# Patient Record
Sex: Male | Born: 1975 | Race: Black or African American | Hispanic: No | Marital: Married | Smoking: Current every day smoker
Health system: Southern US, Community
[De-identification: ages and names within clinical notes are randomized; demographics above are authoritative.]

## PROBLEM LIST (undated history)

## (undated) HISTORY — PX: KNEE SURGERY: SHX244

---

## 1999-06-28 ENCOUNTER — Emergency Department (HOSPITAL_COMMUNITY): Admission: EM | Admit: 1999-06-28 | Discharge: 1999-06-28 | Payer: Self-pay | Admitting: Emergency Medicine

## 1999-06-28 ENCOUNTER — Encounter: Payer: Self-pay | Admitting: Emergency Medicine

## 2004-10-13 ENCOUNTER — Emergency Department (HOSPITAL_COMMUNITY): Admission: EM | Admit: 2004-10-13 | Discharge: 2004-10-13 | Payer: Self-pay | Admitting: Emergency Medicine

## 2005-08-05 ENCOUNTER — Emergency Department (HOSPITAL_COMMUNITY): Admission: EM | Admit: 2005-08-05 | Discharge: 2005-08-05 | Payer: Self-pay | Admitting: Emergency Medicine

## 2009-02-10 ENCOUNTER — Emergency Department (HOSPITAL_COMMUNITY): Admission: EM | Admit: 2009-02-10 | Discharge: 2009-02-11 | Payer: Self-pay | Admitting: Emergency Medicine

## 2009-02-14 ENCOUNTER — Emergency Department (HOSPITAL_COMMUNITY): Admission: EM | Admit: 2009-02-14 | Discharge: 2009-02-14 | Payer: Self-pay | Admitting: Emergency Medicine

## 2009-02-17 ENCOUNTER — Encounter: Admission: RE | Admit: 2009-02-17 | Discharge: 2009-02-17 | Payer: Self-pay | Admitting: Orthopedic Surgery

## 2011-08-07 ENCOUNTER — Emergency Department (HOSPITAL_COMMUNITY)
Admission: EM | Admit: 2011-08-07 | Discharge: 2011-08-07 | Disposition: A | Discharge status: Home / Self Care | Payer: Self-pay | Attending: Emergency Medicine | Admitting: Emergency Medicine

## 2011-08-07 ENCOUNTER — Emergency Department (HOSPITAL_COMMUNITY): Payer: Self-pay

## 2011-08-07 DIAGNOSIS — S0280XA Fracture of other specified skull and facial bones, unspecified side, initial encounter for closed fracture: Secondary | ICD-10-CM | POA: Insufficient documentation

## 2011-08-07 DIAGNOSIS — F1010 Alcohol abuse, uncomplicated: Secondary | ICD-10-CM | POA: Insufficient documentation

## 2011-08-07 DIAGNOSIS — Y92410 Unspecified street and highway as the place of occurrence of the external cause: Secondary | ICD-10-CM | POA: Insufficient documentation

## 2011-08-07 DIAGNOSIS — W010XXA Fall on same level from slipping, tripping and stumbling without subsequent striking against object, initial encounter: Secondary | ICD-10-CM | POA: Insufficient documentation

## 2011-08-07 DIAGNOSIS — R51 Headache: Secondary | ICD-10-CM | POA: Insufficient documentation

## 2011-08-07 DIAGNOSIS — R220 Localized swelling, mass and lump, head: Secondary | ICD-10-CM | POA: Insufficient documentation

## 2011-08-07 DIAGNOSIS — M542 Cervicalgia: Secondary | ICD-10-CM | POA: Insufficient documentation

## 2011-08-07 DIAGNOSIS — T1490XA Injury, unspecified, initial encounter: Secondary | ICD-10-CM | POA: Insufficient documentation

## 2011-08-07 LAB — DIFFERENTIAL
Basophils Absolute: 0 10*3/uL (ref 0.0–0.1)
Lymphocytes Relative: 24 % (ref 12–46)
Lymphs Abs: 1.6 10*3/uL (ref 0.7–4.0)
Monocytes Absolute: 0.6 10*3/uL (ref 0.1–1.0)
Neutro Abs: 4.2 10*3/uL (ref 1.7–7.7)

## 2011-08-07 LAB — CBC
HCT: 38.9 % — ABNORMAL LOW (ref 39.0–52.0)
Hemoglobin: 13.7 g/dL (ref 13.0–17.0)
MCHC: 35.2 g/dL (ref 30.0–36.0)
WBC: 6.6 10*3/uL (ref 4.0–10.5)

## 2011-08-07 LAB — BASIC METABOLIC PANEL
Chloride: 99 mEq/L (ref 96–112)
GFR calc non Af Amer: 90 mL/min — ABNORMAL LOW (ref >90)
Glucose, Bld: 106 mg/dL — ABNORMAL HIGH (ref 70–99)
Potassium: 3.5 mEq/L (ref 3.5–5.1)
Sodium: 136 mEq/L (ref 135–145)

## 2011-08-08 ENCOUNTER — Emergency Department (HOSPITAL_COMMUNITY)
Admission: EM | Admit: 2011-08-08 | Discharge: 2011-08-08 | Disposition: A | Discharge status: Home / Self Care | Payer: Self-pay | Attending: Emergency Medicine | Admitting: Emergency Medicine

## 2011-08-08 DIAGNOSIS — X58XXXA Exposure to other specified factors, initial encounter: Secondary | ICD-10-CM | POA: Insufficient documentation

## 2011-08-08 DIAGNOSIS — S0280XA Fracture of other specified skull and facial bones, unspecified side, initial encounter for closed fracture: Secondary | ICD-10-CM | POA: Insufficient documentation

## 2011-08-08 DIAGNOSIS — S0990XA Unspecified injury of head, initial encounter: Secondary | ICD-10-CM | POA: Insufficient documentation

## 2011-09-13 ENCOUNTER — Encounter: Payer: Self-pay | Admitting: *Deleted

## 2011-09-13 ENCOUNTER — Emergency Department (HOSPITAL_COMMUNITY)
Admission: EM | Admit: 2011-09-13 | Discharge: 2011-09-13 | Disposition: A | Discharge status: Home / Self Care | Payer: Self-pay | Attending: Emergency Medicine | Admitting: Emergency Medicine

## 2011-09-13 DIAGNOSIS — K089 Disorder of teeth and supporting structures, unspecified: Secondary | ICD-10-CM | POA: Insufficient documentation

## 2011-09-13 DIAGNOSIS — X58XXXA Exposure to other specified factors, initial encounter: Secondary | ICD-10-CM | POA: Insufficient documentation

## 2011-09-13 DIAGNOSIS — S025XXA Fracture of tooth (traumatic), initial encounter for closed fracture: Secondary | ICD-10-CM | POA: Insufficient documentation

## 2011-09-13 DIAGNOSIS — K0889 Other specified disorders of teeth and supporting structures: Secondary | ICD-10-CM

## 2011-09-13 DIAGNOSIS — H9209 Otalgia, unspecified ear: Secondary | ICD-10-CM | POA: Insufficient documentation

## 2011-09-13 MED ORDER — HYDROCODONE-ACETAMINOPHEN 5-325 MG PO TABS: 2.0000 | ORAL_TABLET | ORAL | Status: AC | PRN

## 2011-09-13 NOTE — ED Notes (Signed)
Pt has lower left molar tooth pain

## 2011-09-13 NOTE — ED Provider Notes (Signed)
History     CSN: [id number] Arrival date & time: 09/13/2011  3:45 AM   First MD Initiated Contact with Patient 09/13/11 0406      Chief Complaint  Patient presents with  . Dental Pain     HPI  Hx provided by pt.  Pt reports having a piece of left 1st molar break off after getting food stuck between teeth last week.  Pt has had increasing pain described as nerve pain to tooth.  Pt has been busy with work and unable to get in to see his dentist.  Last night pain was at worst and he could not sleep. Pain is worse with cold and hot beverage and sometimes with pressure. Pain radiates to left ear. He denies fever, chills,swelling under tongue, nausea or vomiting.   History reviewed. No pertinent past medical history.  History reviewed. No pertinent past surgical history.  Family History  Problem Relation Age of Onset  . Diabetes Mother   . Coronary artery disease Father     History  Substance Use Topics  . Smoking status: Current Everyday Smoker -- 0.5 packs/day for 20 years    Types: Cigarettes  . Smokeless tobacco: Not on file  . Alcohol Use: Yes     socially      Review of Systems  Constitutional: Negative for fever and chills.  All other systems reviewed and are negative.    Allergies  Review of patient's allergies indicates no known allergies.  Home Medications   Current Outpatient Rx  Name Route Sig Dispense Refill  . ACETAMINOPHEN 500 MG PO TABS Oral Take 1,000 mg by mouth every 6 (six) hours as needed. Headache pain or fever     . IBUPROFEN 200 MG PO TABS Oral Take 200-800 mg by mouth every 6 (six) hours as needed. Headache or pain       BP 140/97  Pulse 81  Temp(Src) 97.1 F (36.2 C) (Oral)  Resp 18  Ht 6\' 2"  (1.88 m)  Wt 200 lb (90.719 kg)  BMI 25.68 kg/m2  SpO2 99%  Physical Exam  Nursing note and vitals reviewed. Constitutional: He is oriented to person, place, and time. He appears well-developed and well-nourished. No distress.  HENT:    Head: Normocephalic.  Mouth/Throat: Oropharynx is clear and moist.       Posterior aspect of left lower 1st molar missing with metal filling in place over top of tooth.  No swelling of gums or under tongue.  No pain with percussion over tooth.  Neck: Normal range of motion.  Cardiovascular: Normal rate.   No murmur heard. Pulmonary/Chest: Effort normal.  Lymphadenopathy:    He has no cervical adenopathy.  Neurological: He is alert and oriented to person, place, and time.  Skin: Skin is warm.  Psychiatric: He has a normal mood and affect.    ED Course  Procedures (including critical care time)    Dental block performed.  Verbal consent obtained. Time out taken. Procedure: Local dental block to left lower first molar  Anesthesia: 1.8 mg bupivacaine with epinephrine.  Patient tolerated procedure well pain improved  Labs Reviewed - No data to display No results found.   1. Pain, dental   2. Dental fracture       MDM          Angus Seller, PA 09/13/11 [phone] Medical screening examination/treatment/procedure(s) were performed by non-physician practitioner and as supervising physician I was immediately available for consultation/collaboration.  Cyndra Numbers, MD 09/13/11 [phone]

## 2011-11-07 ENCOUNTER — Encounter (HOSPITAL_COMMUNITY): Payer: Self-pay | Admitting: *Deleted

## 2011-11-07 ENCOUNTER — Emergency Department (HOSPITAL_COMMUNITY)
Admission: EM | Admit: 2011-11-07 | Discharge: 2011-11-07 | Disposition: A | Discharge status: Home / Self Care | Payer: Self-pay | Attending: Emergency Medicine | Admitting: Emergency Medicine

## 2011-11-07 DIAGNOSIS — K029 Dental caries, unspecified: Secondary | ICD-10-CM | POA: Insufficient documentation

## 2011-11-07 DIAGNOSIS — R220 Localized swelling, mass and lump, head: Secondary | ICD-10-CM | POA: Insufficient documentation

## 2011-11-07 DIAGNOSIS — X58XXXA Exposure to other specified factors, initial encounter: Secondary | ICD-10-CM | POA: Insufficient documentation

## 2011-11-07 DIAGNOSIS — S025XXA Fracture of tooth (traumatic), initial encounter for closed fracture: Secondary | ICD-10-CM | POA: Insufficient documentation

## 2011-11-07 DIAGNOSIS — K089 Disorder of teeth and supporting structures, unspecified: Secondary | ICD-10-CM | POA: Insufficient documentation

## 2011-11-07 MED ORDER — HYDROCODONE-ACETAMINOPHEN 5-325 MG PO TABS: 1.0000 | ORAL_TABLET | ORAL | Status: AC | PRN

## 2011-11-07 MED ORDER — AMOXICILLIN 500 MG PO CAPS: 500.0000 mg | ORAL_CAPSULE | Freq: Three times a day (TID) | ORAL | Status: AC

## 2011-11-07 NOTE — ED Provider Notes (Signed)
History     CSN: [id number]  Arrival date & time 11/07/11  1055   First MD Initiated Contact with Patient 11/07/11 1115      Chief Complaint  Patient presents with  . Facial Swelling  . Dental Pain    (Consider location/radiation/quality/duration/timing/severity/associated sxs/prior treatment) HPI... left lower tooth pain for several days. Patient claims a small piece of tooth broke off while flossing.  Palpation makes it worse. No stiff neck. Pain is moderate  History reviewed. No pertinent past medical history.  Past Surgical History  Procedure Date  . Knee surgery     Family History  Problem Relation Age of Onset  . Diabetes Mother   . Coronary artery disease Father     History  Substance Use Topics  . Smoking status: Current Everyday Smoker -- 0.5 packs/day for 20 years    Types: Cigarettes  . Smokeless tobacco: Not on file  . Alcohol Use: Yes     socially      Review of Systems  All other systems reviewed and are negative.    Allergies  Review of patient's allergies indicates no known allergies.  Home Medications   Current Outpatient Rx  Name Route Sig Dispense Refill  . IBUPROFEN 200 MG PO TABS Oral Take 400 mg by mouth every 6 (six) hours as needed. Headache or pain    . AMOXICILLIN 500 MG PO CAPS Oral Take 1 capsule (500 mg total) by mouth 3 (three) times daily. 30 capsule 0  . HYDROCODONE-ACETAMINOPHEN 5-325 MG PO TABS Oral Take 1-2 tablets by mouth every 4 (four) hours as needed for pain. 25 tablet 0    BP 131/81  Pulse 85  Temp(Src) 98 F (36.7 C) (Oral)  Ht 6\' 2"  (1.88 m)  Wt 200 lb (90.719 kg)  BMI 25.68 kg/m2  SpO2 98%  Physical Exam  Nursing note and vitals reviewed. Constitutional: He is oriented to person, place, and time. He appears well-developed and well-nourished.  HENT:       Obvious caries and left lower premolar.  Tenderness to palpation  Neck: Normal range of motion.  Neurological: He is alert and oriented to person,  place, and time.  Skin: Skin is warm and dry.  Psychiatric: He has a normal mood and affect.    ED Course  Procedures (including critical care time)  Labs Reviewed - No data to display No results found.   1. Tooth fracture       MDM  Rx antibiotics and pain medication        Donnetta Hutching, MD 11/07/11 1439

## 2011-11-07 NOTE — ED Notes (Signed)
Patient has a cracked molar.  He has swelling and pain.  He noted onset of pain 1 day ago.  He states the pain and swelling has increased.

## 2012-01-25 ENCOUNTER — Emergency Department (HOSPITAL_COMMUNITY)
Admission: EM | Admit: 2012-01-25 | Discharge: 2012-01-25 | Disposition: A | Discharge status: Home / Self Care | Payer: Self-pay | Attending: Emergency Medicine | Admitting: Emergency Medicine

## 2012-01-25 DIAGNOSIS — K0889 Other specified disorders of teeth and supporting structures: Secondary | ICD-10-CM

## 2012-01-25 DIAGNOSIS — F17200 Nicotine dependence, unspecified, uncomplicated: Secondary | ICD-10-CM | POA: Insufficient documentation

## 2012-01-25 DIAGNOSIS — K029 Dental caries, unspecified: Secondary | ICD-10-CM | POA: Insufficient documentation

## 2012-01-25 MED ORDER — AMOXICILLIN 500 MG PO CAPS: 500.0000 mg | ORAL_CAPSULE | Freq: Three times a day (TID) | ORAL | Status: AC

## 2012-01-25 MED ORDER — OXYCODONE-ACETAMINOPHEN 5-325 MG PO TABS: 1.0000 | ORAL_TABLET | Freq: Four times a day (QID) | ORAL | Status: AC | PRN

## 2012-01-25 NOTE — ED Provider Notes (Signed)
History     CSN: [id number]  Arrival date & time 01/25/12  1315   First MD Initiated Contact with Patient 01/25/12 1401      Chief Complaint  Patient presents with  . Dental Pain   patient states that he had a partial dental fracture in the right lower molar. A couple of weeks ago. He was using over-the-counter Anusol. He also had seen a dentist, and was waiting to get into a program to assist with pain and in April. He has had worsening pain, over the past couple of days. He denies any significant neck swelling, or induration. Denies any swelling of the tongue or the mouth. No fevers, nausea, vomiting. No other systemic symptoms.  (Consider location/radiation/quality/duration/timing/severity/associated sxs/prior treatment) HPI  No past medical history on file.  Past Surgical History  Procedure Date  . Knee surgery     Family History  Problem Relation Age of Onset  . Diabetes Mother   . Coronary artery disease Father     History  Substance Use Topics  . Smoking status: Current Everyday Smoker -- 0.5 packs/day for 20 years    Types: Cigarettes  . Smokeless tobacco: Not on file  . Alcohol Use: Yes     socially      Review of Systems  All other systems reviewed and are negative.    Allergies  Review of patient's allergies indicates no known allergies.  Home Medications   Current Outpatient Rx  Name Route Sig Dispense Refill  . IBUPROFEN 200 MG PO TABS Oral Take 400 mg by mouth every 6 (six) hours as needed. Headache or pain      BP 129/78  Pulse 83  Temp(Src) 98.1 F (36.7 C) (Oral)  Resp 20  SpO2 96%  Physical Exam  Nursing note and vitals reviewed. Constitutional: He is oriented to person, place, and time. He appears well-developed and well-nourished.  HENT:  Head: Normocephalic and atraumatic.       Right lower molar is discolored, and carious. No obvious surrounding abscess. No odor for the mouth is moist, and soft. Airways patent uvula is midline    Eyes: Conjunctivae and EOM are normal. Pupils are equal, round, and reactive to light.  Neck: Neck supple. No tracheal deviation present. No thyromegaly present.       Minimal 1 small anterior cervical lymph node. There is no induration. No redness no swelling. No significant tenderness to the anterior neck  Cardiovascular: Normal rate and regular rhythm.  Exam reveals no gallop and no friction rub.   No murmur heard. Pulmonary/Chest: Breath sounds normal. He has no wheezes. He has no rales. He exhibits no tenderness.  Abdominal: Soft. Bowel sounds are normal. He exhibits no distension. There is no tenderness. There is no rebound and no guarding.  Musculoskeletal: Normal range of motion.  Neurological: He is alert and oriented to person, place, and time. No cranial nerve deficit. Coordination normal.  Skin: Skin is warm and dry. No rash noted.  Psychiatric: He has a normal mood and affect.    ED Course  Procedures (including critical care time)  Labs Reviewed - No data to display No results found.   No diagnosis found.    MDM  Patient is seen and examined, initial history and physical is completed. Evaluation initiated     Patient seen for dental caries. Will place on a course of amoxicillin and a short course of Percocet for breakthrough pain. He is given multiple numbers for dental followup.  Nirvi Boehler A. Patrica Duel, MD 01/25/12 1610

## 2012-01-25 NOTE — Discharge Instructions (Signed)
Dental Caries  Tooth decay (dental caries, cavities) is the most common of all oral diseases. It occurs in all ages but is more common in children and young adults.  CAUSES  Bacteria in your mouth combine with foods (particularly sugars and starches) to produce plaque. Plaque is a substance that sticks to the hard surfaces of teeth. The bacteria in the plaque produce acids that attack the enamel of teeth. Repeated acid attacks dissolve the enamel and create holes in the teeth. Root surfaces of teeth may also get these holes.  Other contributing factors include:   Frequent snacking and drinking of cavity-producing foods and liquids.   Poor oral hygiene.   Dry mouth.   Substance abuse such as methamphetamine.   Broken or poor fitting dental restorations.   Eating disorders.   Gastroesophageal reflux disease (GERD).   Certain radiation treatments to the head and neck.  SYMPTOMS  At first, dental decay appears as white, chalky areas on the enamel. In this early stage, symptoms are seldom present. As the decay progresses, pits and holes may appear on the enamel surfaces. Progression of the decay will lead to softening of the hard layers of the tooth. At this point you may experience some pain or achy feeling after sweet, hot, or cold foods or drinks are consumed. If left untreated, the decay will reach the internal structures of the tooth and produce severe pain. Extensive dental treatment, such as root canal therapy, may be needed to save the tooth at this late stage of decay development.  DIAGNOSIS  Most cavities will be detected during regular check-ups. A thorough medical and dental history will be taken by the dentist. The dentist will use instruments to check the surfaces of your teeth for any breakdown or discoloration. Some dentists have special instruments, such as lasers, that detect tooth decay. Dental X-rays may also show some cavities that are not visible to the eye (such as between  the contact areas of the teeth). TREATMENT  Treatment involves removal of the tooth decay and replacement with a restorative material such as silver, gold, or composite (white) material. However, if the decay involves a large area of the tooth and there is little remaining healthy tooth structure, a cap (crown) will be fitted over the remaining structure. If the decay involves the center part of the tooth (pulp), root canal treatment will be needed before any type of dental restoration is placed. If the tooth is severely destroyed by the decay process, leaving the remaining tooth structures unrestorable, the tooth will need to be pulled (extracted). Some early tooth decay may be reversed by fluoride treatments and thorough brushing and flossing at home. PREVENTION   Eat healthy foods. Restrict the amount of sugary, starchy foods and liquids you consume. Avoid frequent snacking and drinking of unhealthy foods and liquids.   Sealants can help with prevention of cavities. Sealants are composite resins applied onto the biting surfaces of teeth at risk for decay. They smooth out the pits and grooves and prevent food from being trapped in them. This is done in early childhood before tooth decay has started.   Fluoride tablets may also be prescribed to children between 6 months and 19 years of age if your drinking water is not fluoridated. The fluoride absorbed by the tooth enamel makes teeth less susceptible to decay. Thorough daily cleaning with a toothbrush and dental floss is the best way to prevent cavities. Use of a fluoride toothpaste is highly recommended. Fluoride mouth rinses  may be used in specific cases.   Topical application of fluoride by your dentist is important in children.   Regular visits with a dentist for checkups and cleanings are also important.  SEEK IMMEDIATE DENTAL CARE IF:  You have a fever.   You develop redness and swelling of your face, jaw, or neck.   You develop swelling  around a tooth.   You are unable to open your mouth or cannot swallow.   You have severe pain uncontrolled by pain medicine.  Document Released: 07/14/2002 Document Revised: 10/11/2011 Document Reviewed: 03/29/2011 [facility] Patient Information 2012 [place], [place].    Dental Assistance If the dentist on-call cannot see you, please use the resources below:   Patients with Medicaid: [facility] [phone], [phone] [address] [phone]  If unable to pay, or uninsured, contact [facility] [phone]) or [facility] Department [phone] in [place], [phone] in [facility]) to become qualified for the adult dental clinic  Other Low-Cost Community Dental Services: [facility]- [address] Natasha Bence [place], [place], [address]    [phone], Ext. [phone]    2nd and 4th Thursday of the month at 6:30am    10 clients each day by appointment, can sometimes see walk-in     patients if someone does not show for an appointment [facility]- [address] Ether Griffins [place], [place], [address]    [phone] [facility] [address] [place], [place], [address]    [phone]  [facility] Health Department- [phone] [facility] Health Department- [phone] [facility] [phone]    If you have no primary doctor, here are some resources that may be helpful:  Medicaid-accepting [facility] Providers:   - [facility]- [address]      [phone]      Mon-Fri 9am-7pm, Sat 9am-1pm   - [facility]- [address] [place], [address] [place]      [phone]   - [facility]- [address] [place]      [phone]   [facility]- [address]      [phone]   - Renaye Rakers- [address] [place], [address]      [phone]      Only accepts [place] patients       after they have her name applied to  their card   Self Pay (no insurance) in [place]:   - Sickle Cell Patients: Dr Willey Blade, [facility]      [address] [place]      [phone]   - [facility]662 [phone]   - Physician Referral Service- [phone]   - [facility]- [address] [place]      [place]   [facility]- [address]   - [facility]- see information above      (Speak to [employer] if you do not have insurance)   - [facility]- [address] [place]      [phone]   - [facility] [place]- [address]      [phone]   - [facility]- [address]      [phone]   - Dr Julio Sicks-  [address], [place]      [phone]   -  [facility]- [address]      [phone]   - [facility]- [address]      [phone]      Also [address]      [phone]   - [facility]- [address]      [phone]      1st and 3rd Saturday every month, 10am-1pm Other agencies that provide inexpensive medical care:    [facility]  [phone]    [facility]  [phone]    [facility]  [phone]    [facility]  [phone]    [facility]  [phone]  General Information: Finding a doctor when you do not have health insurance can be tricky. Although you are not limited by an insurance plan, you are of course limited by her finances and how much but he can pay out of pocket.  What are your options if you don't have health insurance?   1) Find a Librarian, academic and Pay Out of Pocket Although you won't have to find out who is covered by your insurance plan, it is a good idea to ask around and get recommendations. You will then need to call the office and see if the doctor you have chosen will accept you as a new patient and what types of options they offer for patients who are self-pay. Some doctors offer discounts or will set  up payment plans for their patients who do not have insurance, but you will need to ask so you aren't surprised when you get to your appointment.  2) Contact Your Local Health Department Not all health departments have doctors that can see patients for sick visits, but many do, so it is worth a call to see if yours does. If you don't know where your local health department is, you can check in your phone book. The CDC also has a tool to help you locate your state's health department, and many state websites also have listings of all of their local health departments.  3) Find a Walk-in Clinic If your illness is not likely to be very severe or complicated, you may want to try a walk in clinic. These are popping up all over the country in pharmacies, drugstores, and shopping centers. They're usually staffed by nurse practitioners or physician assistants that have been trained to treat common illnesses and complaints. They're usually fairly quick and inexpensive. However, if you have serious medical issues or chronic medical problems, these are probably not your best option

## 2012-01-25 NOTE — ED Notes (Signed)
Dental pain onset 1-2 weeks ago bottom right jaw pain currently 10/10 throbbing. Seen Dentist waiting until getting into a program to assist with payment in April. Airway intact

## 2014-10-19 ENCOUNTER — Encounter (HOSPITAL_COMMUNITY): Payer: Self-pay

## 2014-10-19 ENCOUNTER — Emergency Department (HOSPITAL_COMMUNITY)
Admission: EM | Admit: 2014-10-19 | Discharge: 2014-10-19 | Disposition: A | Discharge status: Home / Self Care | Payer: Self-pay | Attending: Emergency Medicine | Admitting: Emergency Medicine

## 2014-10-19 DIAGNOSIS — Z720 Tobacco use: Secondary | ICD-10-CM | POA: Insufficient documentation

## 2014-10-19 DIAGNOSIS — K047 Periapical abscess without sinus: Secondary | ICD-10-CM | POA: Insufficient documentation

## 2014-10-19 MED ORDER — HYDROCODONE-ACETAMINOPHEN 5-325 MG PO TABS
2.0000 | ORAL_TABLET | Freq: Once | ORAL | Status: AC
  Administered 2014-10-19: 2 via ORAL
  Filled 2014-10-19: qty 2

## 2014-10-19 MED ORDER — HYDROCODONE-ACETAMINOPHEN 5-325 MG PO TABS: 2.0000 | ORAL_TABLET | ORAL | Status: DC | PRN

## 2014-10-19 MED ORDER — AMOXICILLIN 500 MG PO CAPS: 500.0000 mg | ORAL_CAPSULE | Freq: Three times a day (TID) | ORAL | Status: DC

## 2014-10-19 NOTE — ED Notes (Signed)
Pt had a tooth extracted and is now having right lower gum pain for the past 2 days.

## 2014-10-19 NOTE — ED Provider Notes (Signed)
CSN: [id number]     Arrival date & time 10/19/14  1603 History  This chart was scribed for non-physician practitioner, Langston MaskerKaren Yanis Juma, PA-C working with Samuel JesterKathleen McManus, DO by Luisa DagoPriscilla Tutu, ED scribe. This patient was seen in room TR08C/TR08C and the patient's care was started at 4:22 PM.    Chief Complaint  Patient presents with  . Dental Pain   Patient is a 38 y.o. male presenting with tooth pain. The history is provided by the patient. No language interpreter was used.  Dental Pain Location:  Lower Quality:  Shooting and throbbing Severity:  Mild Onset quality:  Gradual Duration:  2 days Timing:  Constant Progression:  Worsening Chronicity:  New Context: recent dental surgery (tooth extraction)   Relieved by:  Nothing Worsened by:  Pressure Ineffective treatments:  Acetaminophen Associated symptoms: gum swelling   Associated symptoms: no drooling, no facial pain, no facial swelling, no fever, no headaches, no neck pain, no oral bleeding and no oral lesions   Risk factors: no smoking    HPI Comments: Dwayne Small is a 38 y.o. male who presents to the Emergency Department complaining of lower dental pain which started after he got his lower molar pulled 2 days ago. Pt is now complaining of left sided jaw swelling and pain. He is also complaining of associated bad odor from the tooth. Pt does not have a regular dentist. He denies any fever, chills, nausea, emesis,  SOB, or sore throat.   No past medical history on file. Past Surgical History  Procedure Laterality Date  . Knee surgery     Family History  Problem Relation Age of Onset  . Diabetes Mother   . Coronary artery disease Father    History  Substance Use Topics  . Smoking status: Current Every Day Smoker -- 0.50 packs/day for 20 years    Types: Cigarettes  . Smokeless tobacco: Not on file  . Alcohol Use: Yes     Comment: socially    Review of Systems  Constitutional: Negative for fever.  HENT: Positive for  dental problem. Negative for drooling, facial swelling, mouth sores and sore throat.   Respiratory: Negative for shortness of breath.   Musculoskeletal: Negative for neck pain and neck stiffness.  Neurological: Negative for headaches.      Allergies  Review of patient's allergies indicates no known allergies.  Home Medications   Prior to Admission medications   Medication Sig Start Date End Date Taking? Authorizing Provider  ibuprofen (ADVIL,MOTRIN) 200 MG tablet Take 400 mg by mouth every 6 (six) hours as needed. Headache or pain    Historical Provider, MD   Triage Vitals:BP 127/68 mmHg  Pulse 93  Temp(Src) 98.9 F (37.2 C) (Oral)  Resp 18  Ht 6\' 2"  (1.88 m)  Wt 210 lb (95.255 kg)  BMI 26.95 kg/m2  SpO2 98%    Physical Exam  Constitutional: He is oriented to person, place, and time. He appears well-developed and well-nourished. No distress.  HENT:  Head: Normocephalic and atraumatic.  Eyes: Conjunctivae and EOM are normal.  Neck: Neck supple. No tracheal deviation present.  Cardiovascular: Normal rate.   Pulmonary/Chest: Effort normal. No respiratory distress.  Musculoskeletal: Normal range of motion.  Neurological: He is alert and oriented to person, place, and time.  Skin: Skin is warm and dry.  Psychiatric: He has a normal mood and affect. His behavior is normal.  Nursing note and vitals reviewed.   ED Course  Procedures (including critical care time)  DIAGNOSTIC  STUDIES: Oxygen Saturation is 98% on RA, normal by my interpretation.    COORDINATION OF CARE: 4:24 PM- Will give pt a referral to on-call dentist. Pt advised of plan for treatment and pt agrees.  Labs Review Labs Reviewed - No data to display  Imaging Review No results found.   EKG Interpretation None      MDM   Final diagnoses:  Dental infection    amoxicillian Hydrocodone Referral to dr. Lawrence Marseillescivils  No dentist on call I personally performed the services described in this  documentation, which was scribed in my presence. The recorded information has been reviewed and is accurate.    Elson AreasLeslie K Mintie Witherington, PA-C 10/19/14 1636  Samuel JesterKathleen McManus, DO 10/20/14 [phone]

## 2014-10-19 NOTE — Discharge Instructions (Signed)

## 2014-11-18 ENCOUNTER — Encounter (HOSPITAL_COMMUNITY): Payer: Self-pay | Admitting: Emergency Medicine

## 2014-11-18 ENCOUNTER — Emergency Department (HOSPITAL_COMMUNITY)
Admission: EM | Admit: 2014-11-18 | Discharge: 2014-11-18 | Disposition: A | Discharge status: Home / Self Care | Payer: Self-pay | Attending: Emergency Medicine | Admitting: Emergency Medicine

## 2014-11-18 DIAGNOSIS — F10920 Alcohol use, unspecified with intoxication, uncomplicated: Secondary | ICD-10-CM

## 2014-11-18 DIAGNOSIS — F10129 Alcohol abuse with intoxication, unspecified: Secondary | ICD-10-CM | POA: Insufficient documentation

## 2014-11-18 DIAGNOSIS — Z720 Tobacco use: Secondary | ICD-10-CM | POA: Insufficient documentation

## 2014-11-18 NOTE — ED Notes (Signed)
Per EMS: Pt found dressed in a suit and tie laying in the grass outside of [employer].  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  Pt has had a lot of alcohol today.  Pt nodding off in triage.
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
## 2014-11-21 ENCOUNTER — Emergency Department (HOSPITAL_COMMUNITY)
Admission: EM | Admit: 2014-11-21 | Discharge: 2014-11-22 | Disposition: A | Discharge status: Home / Self Care | Payer: Self-pay | Attending: Emergency Medicine | Admitting: Emergency Medicine

## 2014-11-21 ENCOUNTER — Encounter (HOSPITAL_COMMUNITY): Payer: Self-pay | Admitting: Emergency Medicine

## 2014-11-21 DIAGNOSIS — F1014 Alcohol abuse with alcohol-induced mood disorder: Secondary | ICD-10-CM | POA: Insufficient documentation

## 2014-11-21 DIAGNOSIS — Z720 Tobacco use: Secondary | ICD-10-CM | POA: Insufficient documentation

## 2014-11-21 DIAGNOSIS — F10120 Alcohol abuse with intoxication, uncomplicated: Secondary | ICD-10-CM | POA: Insufficient documentation

## 2014-11-21 DIAGNOSIS — F10920 Alcohol use, unspecified with intoxication, uncomplicated: Secondary | ICD-10-CM

## 2014-11-21 DIAGNOSIS — F1010 Alcohol abuse, uncomplicated: Secondary | ICD-10-CM | POA: Diagnosis present

## 2014-11-21 DIAGNOSIS — R45851 Suicidal ideations: Secondary | ICD-10-CM

## 2014-11-21 DIAGNOSIS — F10929 Alcohol use, unspecified with intoxication, unspecified: Secondary | ICD-10-CM | POA: Diagnosis present

## 2014-11-21 DIAGNOSIS — F1994 Other psychoactive substance use, unspecified with psychoactive substance-induced mood disorder: Secondary | ICD-10-CM | POA: Diagnosis present

## 2014-11-21 LAB — COMPREHENSIVE METABOLIC PANEL
ALK PHOS: 51 U/L (ref 39–117)
ALT: 18 U/L (ref 0–53)
AST: 30 U/L (ref 0–37)
Albumin: 4.7 g/dL (ref 3.5–5.2)
Anion gap: 9 (ref 5–15)
BILIRUBIN TOTAL: 0.5 mg/dL (ref 0.3–1.2)
BUN: 16 mg/dL (ref 6–23)
CALCIUM: 8.9 mg/dL (ref 8.4–10.5)
CO2: 29 mmol/L (ref 19–32)
CREATININE: 1.05 mg/dL (ref 0.50–1.35)
Chloride: 104 mEq/L (ref 96–112)
GFR, EST NON AFRICAN AMERICAN: 88 mL/min — AB (ref >90)
Glucose, Bld: 96 mg/dL (ref 70–99)
Potassium: 4.2 mmol/L (ref 3.5–5.1)
Sodium: 142 mmol/L (ref 135–145)
Total Protein: 7.7 g/dL (ref 6.0–8.3)

## 2014-11-21 LAB — CBC
HCT: 40.6 % (ref 39.0–52.0)
HEMOGLOBIN: 14.1 g/dL (ref 13.0–17.0)
MCH: 32.3 pg (ref 26.0–34.0)
MCHC: 34.7 g/dL (ref 30.0–36.0)
MCV: 92.9 fL (ref 78.0–100.0)
PLATELETS: 227 10*3/uL (ref 150–400)
RBC: 4.37 MIL/uL (ref 4.22–5.81)
RDW: 12.6 % (ref 11.5–15.5)
WBC: 7.1 10*3/uL (ref 4.0–10.5)

## 2014-11-21 LAB — SALICYLATE LEVEL

## 2014-11-21 LAB — ACETAMINOPHEN LEVEL

## 2014-11-21 LAB — ETHANOL: ALCOHOL ETHYL (B): 279 mg/dL — AB (ref 0–9)

## 2014-11-21 NOTE — ED Notes (Signed)
Pt is refusing to get vitals done

## 2014-11-21 NOTE — ED Notes (Signed)
Pt arrived to the ED under police escort under IVC paperwork.  Pt's paperwork states that he has made suicidal ideation by jumping in front of a train.  Pt has etoh on board.  Pt is upset about situation.

## 2014-11-21 NOTE — BH Assessment (Addendum)
Per Dr. Gwendolyn GrantWalden he is working on first opinion. Pt was brought in under IVC due to telling family he was going to jump off a bridge. Pt denies this now, and has etoh on board. Assessment to be initiated once order has been placed. Labs are pending. Pt was in ED on 11/18/14 for etoh intoxication after being found passed out drunk at [employer].   Clista BernhardtNancy Takota Cahalan, [facility] Triage Specialist 11/21/2014 10:14 PM

## 2014-11-21 NOTE — ED Provider Notes (Signed)
CSN: [id number]     Arrival date & time 11/21/14  2020 History   First MD Initiated Contact with Patient 11/21/14 2120     Chief Complaint  Patient presents with  . IVC    IVC     (Consider location/radiation/quality/duration/timing/severity/associated sxs/prior Treatment) Patient is a 39 y.o. male presenting with mental health disorder. The history is provided by the patient.  Mental Health Problem Presenting symptoms: suicidal thoughts and suicidal threats   Patient accompanied by:  Law enforcement Degree of incapacity (severity):  Moderate Onset quality:  Gradual Timing:  Constant Progression:  Unchanged Chronicity:  New Context: alcohol use   Associated symptoms: no abdominal pain     History reviewed. No pertinent past medical history. Past Surgical History  Procedure Laterality Date  . Knee surgery     Family History  Problem Relation Age of Onset  . Diabetes Mother   . Coronary artery disease Father    History  Substance Use Topics  . Smoking status: Current Every Day Smoker -- 0.50 packs/day for 20 years    Types: Cigarettes  . Smokeless tobacco: Not on file  . Alcohol Use: Yes     Comment: socially    Review of Systems  Constitutional: Negative for fever.  Respiratory: Negative for cough and shortness of breath.   Gastrointestinal: Negative for vomiting and abdominal pain.  Psychiatric/Behavioral: Positive for suicidal ideas.  All other systems reviewed and are negative.     Allergies  Review of patient's allergies indicates no known allergies.  Home Medications   Prior to Admission medications   Medication Sig Start Date End Date Taking? Authorizing Provider  ibuprofen (ADVIL,MOTRIN) 200 MG tablet Take 400 mg by mouth every 6 (six) hours as needed. Headache or pain   Yes Historical Provider, MD   BP 120/72 mmHg  Pulse 104  Temp(Src) 98.1 F (36.7 C) (Oral)  Resp 18  SpO2 98% Physical Exam  Constitutional: He is oriented to person, place,  and time. He appears well-developed and well-nourished. No distress.  HENT:  Head: Normocephalic and atraumatic.  Mouth/Throat: No oropharyngeal exudate.  Eyes: EOM are normal. Pupils are equal, round, and reactive to light.  Neck: Normal range of motion. Neck supple.  Cardiovascular: Normal rate and regular rhythm.  Exam reveals no friction rub.   No murmur heard. Pulmonary/Chest: Effort normal and breath sounds normal. No respiratory distress. He has no wheezes. He has no rales.  Abdominal: He exhibits no distension. There is no tenderness. There is no rebound.  Musculoskeletal: Normal range of motion. He exhibits no edema.  Neurological: He is alert and oriented to person, place, and time.  Skin: He is not diaphoretic.  Nursing note and vitals reviewed.   ED Course  Procedures (including critical care time) Labs Review Labs Reviewed  CBC  ACETAMINOPHEN LEVEL  COMPREHENSIVE METABOLIC PANEL  ETHANOL  SALICYLATE LEVEL  URINE RAPID DRUG SCREEN (HOSP PERFORMED)    Imaging Review No results found.   EKG Interpretation None      MDM   Final diagnoses:  Alcohol intoxication, uncomplicated  Suicidal ideation    2M presents under IVC. He was intoxicated and told his family he was suicidal and wanting to jump in front of a train. Here denies suicidality. He is intoxicated, but communicative and cooperative. Will clear medically and consult TTS.    Elwin MochaBlair Terese Heier, MD 11/21/14 [phone]

## 2014-11-22 DIAGNOSIS — R45851 Suicidal ideations: Secondary | ICD-10-CM

## 2014-11-22 DIAGNOSIS — F10120 Alcohol abuse with intoxication, uncomplicated: Secondary | ICD-10-CM

## 2014-11-22 DIAGNOSIS — F1010 Alcohol abuse, uncomplicated: Secondary | ICD-10-CM | POA: Diagnosis present

## 2014-11-22 DIAGNOSIS — F10929 Alcohol use, unspecified with intoxication, unspecified: Secondary | ICD-10-CM | POA: Diagnosis present

## 2014-11-22 DIAGNOSIS — F1994 Other psychoactive substance use, unspecified with psychoactive substance-induced mood disorder: Secondary | ICD-10-CM

## 2014-11-22 LAB — RAPID URINE DRUG SCREEN, HOSP PERFORMED
Amphetamines: NOT DETECTED
BENZODIAZEPINES: NOT DETECTED
Barbiturates: NOT DETECTED
Cocaine: NOT DETECTED
Opiates: NOT DETECTED
Tetrahydrocannabinol: NOT DETECTED

## 2014-11-22 MED ORDER — LORAZEPAM 2 MG/ML IJ SOLN: 0.0000 mg | Freq: Four times a day (QID) | INTRAMUSCULAR | Status: DC

## 2014-11-22 MED ORDER — NICOTINE 21 MG/24HR TD PT24
21.0000 mg | MEDICATED_PATCH | Freq: Every day | TRANSDERMAL | Status: DC
  Administered 2014-11-22: 21 mg via TRANSDERMAL
  Filled 2014-11-22: qty 1

## 2014-11-22 MED ORDER — LORAZEPAM 1 MG PO TABS: 1.0000 mg | ORAL_TABLET | Freq: Three times a day (TID) | ORAL | Status: DC | PRN

## 2014-11-22 MED ORDER — ACETAMINOPHEN 325 MG PO TABS: 650.0000 mg | ORAL_TABLET | ORAL | Status: DC | PRN

## 2014-11-22 MED ORDER — IBUPROFEN 200 MG PO TABS: 600.0000 mg | ORAL_TABLET | Freq: Three times a day (TID) | ORAL | Status: DC | PRN

## 2014-11-22 MED ORDER — LORAZEPAM 1 MG PO TABS: 0.0000 mg | ORAL_TABLET | Freq: Four times a day (QID) | ORAL | Status: DC

## 2014-11-22 MED ORDER — ALUM & MAG HYDROXIDE-SIMETH 200-200-20 MG/5ML PO SUSP: 30.0000 mL | ORAL | Status: DC | PRN

## 2014-11-22 MED ORDER — ZOLPIDEM TARTRATE 5 MG PO TABS: 5.0000 mg | ORAL_TABLET | Freq: Every evening | ORAL | Status: DC | PRN

## 2014-11-22 MED ORDER — LORAZEPAM 1 MG PO TABS: 0.0000 mg | ORAL_TABLET | Freq: Two times a day (BID) | ORAL | Status: DC

## 2014-11-22 MED ORDER — ONDANSETRON HCL 4 MG PO TABS: 4.0000 mg | ORAL_TABLET | Freq: Three times a day (TID) | ORAL | Status: DC | PRN

## 2014-11-22 MED ORDER — LORAZEPAM 2 MG/ML IJ SOLN: 0.0000 mg | Freq: Two times a day (BID) | INTRAMUSCULAR | Status: DC

## 2014-11-22 NOTE — ED Notes (Signed)
Pt has been asked multiple times to give a urine sample but each time pt says he is unable to give a sample and refuses to try.

## 2014-11-22 NOTE — ED Notes (Signed)
Bed: [id number] Expected date:  Expected time:  Means of arrival:  Comments: Room [address] [place]

## 2014-11-22 NOTE — [facility] Suicide Risk Assessment (Signed)
Suicide Risk Assessment  Discharge Assessment     Demographic Factors:  Male  Total Time spent with patient: 45 minutes  Psychiatric Specialty Exam:     Blood pressure 128/70, pulse 80, temperature 98.8 F (37.1 C), temperature source Oral, resp. rate 20, SpO2 96 %.There is no weight on file to calculate BMI.  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 Housing Leisure Time Physical Health Resilience Social Support Vocational/Educational  Sleep:      Musculoskeletal: Strength & Muscle Tone: within normal limits Gait & Station: normal Patient leans: N/A  Mental Status Per Nursing Assessment::   On Admission:   Alcohol intoxication  Current Mental Status by Physician: NA  Loss Factors: NA  Historical Factors: NA  Risk Reduction Factors:   Sense of responsibility to family, Employed, Living with another person, especially a relative, Positive social support and Positive coping skills or problem solving skills  Continued Clinical Symptoms:  Anxious to leave to return to work  Cognitive Features That Contribute To Risk:  None   Suicide Risk:  Minimal: No identifiable suicidal ideation.  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 No pertinent past medical history. AXIS IV:  other psychosocial or environmental problems and problems related to social environment AXIS V:  61-70 mild symptoms  Plan Of Care/Follow-up recommendations:  Activity:  as tolerated Diet:  heart healthy diet  Is patient on multiple antipsychotic therapies at discharge:  No   Has Patient had three or more failed trials of antipsychotic monotherapy by history:  No  Recommended Plan for Multiple Antipsychotic Therapies: NA    Dwayne Small, PMH-NP 11/22/2014, 4:37 PM

## 2014-11-22 NOTE — BH Assessment (Signed)
[facility] Assessment Progress Note  Pt's IVC has been rescinded by Thedore MinsMojeed Akintayo, MD.  Doylene Canninghomas Koltan Portocarrero, MA Triage Specialist 11/22/2014 @ 14:37

## 2014-11-22 NOTE — Progress Notes (Signed)
   CARE MANAGEMENT ED NOTE 11/22/2014  Patient:  Dwayne Small,Dwayne Small   Account Number:  [id number]  Date Initiated:  11/22/2014  Documentation initiated by:  Edd ArbourGIBBS,Alberta Cairns  Subjective/Objective Assessment:   [age] yr old self pay [place] pt  Pt IVC intoxicated told family he was SI wanting to jump in front of a train dx etoh intoxication , etoh use diosrder severe, depression disorder positive for benzo & marijuana 1st ED visit in last 6 m     Subjective/Objective Assessment Detail:   no pcp no coverage per pt  Updated his telephone number in EPIC [phone]  Deleted old number [phone]  Pt agreed to P4 CC referral     Action/Plan:   noted no pcp no coverage spoke with [facility] referral completed   Action/Plan Detail:   Anticipated DC Date:       Status Recommendation to Physician:   Result of Recommendation:    Other ED Services  Consult Working Psychologist, educationallan    DC Planning Services  Other  Outpatient Services - Pt will follow up  PCP issues  [facility] / P4[facility] (established/new)    Choice offered to / List presented to:            Status of service:  Completed, signed off  ED Comments:   ED Comments Detail:  CM spoke with pt who [place]. CM discussed and provided written information for self pay pcps, importance of pcp for f/u care, www.needymeds.org, www.goodrx.com, discounted pharmacies and other [place] resources such as [facility], [facility], affordable care act,  financial assistance, DSS and  health department  Reviewed resources for [place] self pay pcps like [facility], [facility] medicine at [address], [facility], general medical clinics, [facility] plus others, medication resources, [facility] out patient pharmacies and housing Pt voiced understanding and appreciation of resources provided  Provided [facility] contact information

## 2014-11-22 NOTE — Discharge Instructions (Signed)
To help you maintain a sober lifestyle a substance abuse treatment program may be beneficial to you.  Contact the following facilities at your earliest opportunity to see about enrolling:  RESIDENTIAL PROGRAMS:       [facility]      [address]      [place], [place] [address]      [phone]       [facility]      [address]      [place], [place] [address]      [phone]       [facility]      [address]      [place], [place] [address]      [phone]) [phone]  OUTPATIENT PROGRAMS:       [facility]      [address]      [place], [place] [address]      [phone]       [facility]      [address]      [place], [place] [address]      [phone]
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
## 2018-03-26 ENCOUNTER — Emergency Department (HOSPITAL_COMMUNITY): Payer: Self-pay

## 2018-03-26 ENCOUNTER — Encounter (HOSPITAL_COMMUNITY): Payer: Self-pay | Admitting: Emergency Medicine

## 2018-03-26 ENCOUNTER — Emergency Department (HOSPITAL_COMMUNITY)
Admission: EM | Admit: 2018-03-26 | Discharge: 2018-03-26 | Disposition: A | Discharge status: Home / Self Care | Payer: Self-pay | Attending: Physician Assistant | Admitting: Physician Assistant

## 2018-03-26 ENCOUNTER — Other Ambulatory Visit: Payer: Self-pay

## 2018-03-26 DIAGNOSIS — F17210 Nicotine dependence, cigarettes, uncomplicated: Secondary | ICD-10-CM | POA: Insufficient documentation

## 2018-03-26 DIAGNOSIS — G44201 Tension-type headache, unspecified, intractable: Secondary | ICD-10-CM | POA: Insufficient documentation

## 2018-03-26 MED ORDER — KETOROLAC TROMETHAMINE 30 MG/ML IJ SOLN
30.0000 mg | Freq: Once | INTRAMUSCULAR | Status: AC
  Administered 2018-03-26: 30 mg via INTRAMUSCULAR
  Filled 2018-03-26: qty 1

## 2018-03-26 NOTE — ED Provider Notes (Signed)
[facility]-EMERGENCY DEPT Provider Note   CSN: [id number] Arrival date & time: 03/26/18  1018     History   Chief Complaint Chief Complaint  Patient presents with  . Headache    HPI Dwayne Small is a 42 y.o. male with a past medical history of alcohol abuse, who presents to ED for evaluation of headache.  He states that he has had headache "on the top of my head" for the past several years but has worsened in the past 2 weeks.  He states that he usually "drinks to numb away the pain" but he has not had an alcoholic drink in about 2 to 2-1/2 weeks.  He is in an AA program. He is not taking any other medications to help with his symptoms.  He reports intermittent blurry vision for the past 2 weeks as well.  Denies any head injury, loss of consciousness, neck pain, fevers, vomiting, numbness in arms or legs, history of aneurysms.  HPI  History reviewed. No pertinent past medical history.  Patient Active Problem List   Diagnosis Date Noted  . Alcohol abuse 11/22/2014  . Alcohol intoxication (HCC) 11/22/2014  . Substance induced mood disorder (HCC) 11/22/2014  . Suicidal ideations 11/22/2014    Past Surgical History:  Procedure Laterality Date  . KNEE SURGERY          Home Medications    Prior to Admission medications   Not on File    Family History Family History  Problem Relation Age of Onset  . Diabetes Mother   . Coronary artery disease Father     Social History Social History   Tobacco Use  . Smoking status: Current Every Day Smoker    Packs/day: 0.50    Years: 20.00    Pack years: 10.00    Types: Cigarettes  Substance Use Topics  . Alcohol use: Yes    Comment: socially  . Drug use: No     Allergies   Patient has no known allergies.   Review of Systems Review of Systems  Constitutional: Negative for appetite change, chills and fever.  HENT: Negative for ear pain, rhinorrhea, sneezing and sore throat.   Eyes: Positive  for visual disturbance. Negative for photophobia.  Respiratory: Negative for cough, chest tightness, shortness of breath and wheezing.   Cardiovascular: Negative for chest pain and palpitations.  Gastrointestinal: Negative for abdominal pain, blood in stool, constipation, diarrhea, nausea and vomiting.  Genitourinary: Negative for dysuria, hematuria and urgency.  Musculoskeletal: Negative for myalgias.  Skin: Negative for rash.  Neurological: Positive for headaches. Negative for dizziness, weakness and light-headedness.     Physical Exam Updated Vital Signs BP 131/82 (BP Location: Left Arm)   Pulse 79   Temp 98.1 F (36.7 C) (Oral)   Resp 18   Ht  (1.905 m)   Wt 90.7 kg (200 lb)   SpO2 100%   BMI 25.00 kg/m   Physical Exam  Constitutional: He is oriented to person, place, and time. He appears well-developed and well-nourished. No distress.  HENT:  Head: Normocephalic and atraumatic.  Nose: Nose normal.  Eyes: Conjunctivae and EOM are normal. Left eye exhibits no discharge. No scleral icterus.  Neck: Normal range of motion. Neck supple.  No meningismus.  Cardiovascular: Normal rate, regular rhythm, normal heart sounds and intact distal pulses. Exam reveals no gallop and no friction rub.  No murmur heard. Pulmonary/Chest: Effort normal and breath sounds normal. No respiratory distress.  Abdominal: Soft. Bowel sounds  are normal. He exhibits no distension. There is no tenderness. There is no guarding.  Musculoskeletal: Normal range of motion. He exhibits no edema.  Neurological: He is alert and oriented to person, place, and time. He exhibits normal muscle tone. Coordination and gait normal.  Pupils reactive. No facial asymmetry noted. Cranial nerves appear grossly intact. Sensation intact to light touch on face, BUE and BLE. Strength 5/5 in BUE and BLE. Normal finger to nose coordination bilaterally.  Skin: Skin is warm and dry. No rash noted.  Psychiatric: He has a normal  mood and affect.  Nursing note and vitals reviewed.    ED Treatments / Results  Labs (all labs ordered are listed, but only abnormal results are displayed) Labs Reviewed - No data to display  EKG None  Radiology Ct Head Wo Contrast  Result Date: 03/26/2018 CLINICAL DATA:  Headaches for months worse in past few weeks, worsening pressure EXAM: CT HEAD WITHOUT CONTRAST TECHNIQUE: Contiguous axial images were obtained from the base of the skull through the vertex without intravenous contrast. Sagittal and coronal MPR images reconstructed from axial data set. COMPARISON:  11/27/2016 FINDINGS: Brain: Normal ventricular morphology. No midline shift or mass effect. Normal appearance of brain parenchyma. No intracranial hemorrhage, mass lesion, evidence of acute infarction, or extra-axial fluid collection. Vascular: Normal appearance Skull: Intact Sinuses/Orbits: Clear Other: N/A IMPRESSION: Normal exam. Electronically Signed   By: Ulyses Southward M.D.   On: 03/26/2018 12:44    Procedures Procedures (including critical care time)  Medications Ordered in ED Medications  ketorolac (TORADOL) 30 MG/ML injection 30 mg (30 mg Intramuscular Given 03/26/18 1324)     Initial Impression / Assessment and Plan / ED Course  I have reviewed the triage vital signs and the nursing notes.  Pertinent labs & imaging results that were available during my care of the patient were reviewed by me and considered in my medical decision making (see chart for details).     Patient, with a past medical history of alcohol abuse, presents to ED for evaluation of headache.  Headache has been going on for the past several years but worsened in the past 2 weeks.  Located on the top of his head.  He is currently in Alcoholics Anonymous program and has not had an alcoholic beverage in 2 to 2-1/2 weeks.  States that he usually uses this to help with his headaches.  Denies any head injuries or falls, neck pain, fevers, vomiting,  numbness in arms or legs.  On physical exam patient is overall well-appearing.  He does not appear dehydrated.  No deficits on neurological exam noted.  No meningismus.  He is afebrile.  CT of the head was done due to worsening headache and intermittent blurry vision which returned as negative.  Suspect symptoms are due to a tension type headache.  He does not appear to be withdrawing from alcohol. There are no headache characteristics that are lateralizing or concerning for increased ICP, infectious or vascular cause of his symptoms.    Reports improvement in his symptoms with Toradol given here.  Advised to return to ED for any severe worsening symptoms.  Portions of this note were generated with Scientist, clinical (histocompatibility and immunogenetics). Dictation errors may occur despite best attempts at proofreading.  Final Clinical Impressions(s) / ED Diagnoses   Final diagnoses:  Acute intractable tension-type headache    ED Discharge Orders    None       Dietrich Pates, PA-C 03/26/18 1332    Mackuen, Courteney  Lyn, MD 03/26/18 1545

## 2018-03-26 NOTE — ED Triage Notes (Signed)
Worsening pressure in head for a few weeks; "I might as well get checked out since I brought my son here."

## 2018-03-26 NOTE — Discharge Instructions (Signed)
Continue over-the-counter medications such as ibuprofen, Aleve or Tylenol to help with her headache. The CT of your head today was normal.

## 2018-07-15 ENCOUNTER — Emergency Department (HOSPITAL_COMMUNITY)
Admission: EM | Admit: 2018-07-15 | Discharge: 2018-07-15 | Disposition: A | Discharge status: Home / Self Care | Payer: Self-pay | Attending: Emergency Medicine | Admitting: Emergency Medicine

## 2018-07-15 ENCOUNTER — Emergency Department (HOSPITAL_COMMUNITY)
Admission: EM | Admit: 2018-07-15 | Discharge: 2018-07-16 | Disposition: A | Discharge status: Home / Self Care | Payer: Self-pay | Attending: Emergency Medicine | Admitting: Emergency Medicine

## 2018-07-15 ENCOUNTER — Encounter (HOSPITAL_COMMUNITY): Payer: Self-pay | Admitting: Family Medicine

## 2018-07-15 ENCOUNTER — Other Ambulatory Visit: Payer: Self-pay

## 2018-07-15 DIAGNOSIS — F17210 Nicotine dependence, cigarettes, uncomplicated: Secondary | ICD-10-CM | POA: Insufficient documentation

## 2018-07-15 DIAGNOSIS — J029 Acute pharyngitis, unspecified: Secondary | ICD-10-CM | POA: Insufficient documentation

## 2018-07-15 DIAGNOSIS — F1010 Alcohol abuse, uncomplicated: Secondary | ICD-10-CM | POA: Insufficient documentation

## 2018-07-15 DIAGNOSIS — J028 Acute pharyngitis due to other specified organisms: Secondary | ICD-10-CM

## 2018-07-15 LAB — GROUP A STREP BY PCR: GROUP A STREP BY PCR: NOT DETECTED

## 2018-07-15 MED ORDER — IBUPROFEN 800 MG PO TABS: 800.0000 mg | ORAL_TABLET | Freq: Three times a day (TID) | ORAL | 0 refills | Status: DC

## 2018-07-15 MED ORDER — IBUPROFEN 800 MG PO TABS
800.0000 mg | ORAL_TABLET | Freq: Once | ORAL | Status: AC
  Administered 2018-07-15: 800 mg via ORAL
  Filled 2018-07-15: qty 1

## 2018-07-15 NOTE — Discharge Instructions (Signed)
Continue to take ibuprofen. This can last up to one week

## 2018-07-15 NOTE — ED Notes (Signed)
Strep swab collected by PA. 

## 2018-07-15 NOTE — ED Provider Notes (Signed)
[facility]-EMERGENCY DEPT Provider Note   CSN: [id number] Arrival date & time: 07/15/18  1205     History   Chief Complaint Chief Complaint  Patient presents with  . Sore Throat  . Otalgia    HPI Dwayne Small is a 42 y.o. male.  Pt comes in with c/o sore throat that radiates to the ear that started today. Denies fever. Hasn't taken anything the symptoms. No abdominal pain or fever. Denies congestion     No past medical history on file.  Patient Active Problem List   Diagnosis Date Noted  . Alcohol abuse 11/22/2014  . Alcohol intoxication (HCC) 11/22/2014  . Substance induced mood disorder (HCC) 11/22/2014  . Suicidal ideations 11/22/2014    Past Surgical History:  Procedure Laterality Date  . KNEE SURGERY          Home Medications    Prior to Admission medications   Not on File    Family History Family History  Problem Relation Age of Onset  . Diabetes Mother   . Coronary artery disease Father     Social History Social History   Tobacco Use  . Smoking status: Current Every Day Smoker    Packs/day: 0.50    Years: 20.00    Pack years: 10.00    Types: Cigarettes  Substance Use Topics  . Alcohol use: Yes    Comment: socially  . Drug use: No     Allergies   Patient has no known allergies.   Review of Systems Review of Systems  Constitutional: Negative.   HENT: Positive for ear pain and sore throat.   Respiratory: Negative.   Cardiovascular: Negative.   Gastrointestinal: Negative.   Musculoskeletal: Negative.   Skin: Negative.      Physical Exam Updated Vital Signs BP (!) 156/99 (BP Location: Right Arm)   Pulse 96   Temp 98.6 F (37 C) (Oral)   Resp 18   Wt 86.2 kg   SpO2 100%   BMI 23.75 kg/m   Physical Exam  Constitutional: He appears well-developed and well-nourished.  HENT:  Head: Normocephalic and atraumatic.  Right Ear: Tympanic membrane normal.  Mouth/Throat: Mucous membranes are normal.  Posterior oropharyngeal erythema present.  Pulmonary/Chest: Effort normal.  Abdominal: Soft. Normal appearance.  Skin: Skin is warm and dry.     ED Treatments / Results  Labs (all labs ordered are listed, but only abnormal results are displayed) Labs Reviewed  GROUP A STREP BY PCR    EKG None  Radiology No results found.  Procedures Procedures (including critical care time)  Medications Ordered in ED Medications  ibuprofen (ADVIL,MOTRIN) tablet 800 mg (800 mg Oral Given 07/15/18 1244)     Initial Impression / Assessment and Plan / ED Course  I have reviewed the triage vital signs and the nursing notes.  Pertinent labs & imaging results that were available during my care of the patient were reviewed by me and considered in my medical decision making (see chart for details).  Clinical Course as of Jul 16 1251  Tue Jul 15, 2018  1251 Group A Strep by PCR [VP]    Clinical Course User Index [VP] Teressa Lower, NP    Strep negative. No sign of abscess. Likely viral in nature. Discussed supportive care with pt  Final Clinical Impressions(s) / ED Diagnoses   Final diagnoses:  None    ED Discharge Orders    None       Teressa Lower, NP 07/15/18 1350  Virgina Norfolk, DO 07/15/18 [phone]

## 2018-07-15 NOTE — ED Triage Notes (Signed)
Patient presents with a letter from an e-mail on his phone reporting he has been accepted to the [facility] in [place], [place] on July 09, 2018. Patient reports he called about the letter after leaving [facility] from a previous visit for pharyngitis. Patient reports the program needs him to have a letter for detox.

## 2018-07-15 NOTE — ED Notes (Signed)
No answer in WR

## 2018-07-15 NOTE — ED Triage Notes (Signed)
Pt reports that he woke this morning with right side throat pain and ear pain. Pt reports difficulty/pain upon swallowing

## 2018-07-15 NOTE — ED Notes (Signed)
Bed: [facility] Expected date:  Expected time:  Means of arrival:  Comments: 

## 2018-07-15 NOTE — ED Notes (Signed)
Upon going into room to DC patient. Mother is at bedside. Mother states "He is on drugs and is requesting detox" Pt has not mentioned detox to this RN or the provider. Mother is insisting that we keep him for detox. When pt asked directly about wanting detox pt shakes his head no. Pt slings covers off and jumps out of chair upset with mother. Pt leaves the room. Pt then returns for phone. Pt is provided with DC paper work. Pt refusing to sign and this RN is unable to obtain vitals. Mother requesting this RN speaks with the pts sister. This RN made family aware that since the pt is alert and oriented and mentally competent that the patient has to consent and request to Detox. Pt has not requested detox in his visit here today. PA made aware.

## 2018-07-16 NOTE — Discharge Instructions (Signed)
Call the facilities on the resource list provided to find a detox unit that will meet the criteria for your rehab program as we do not admit for detox without medical necessity.

## 2018-07-16 NOTE — ED Notes (Signed)
While signing discharge paperwork, pt stated to staff members that "you don't care about me. That's alright you'll see me on the news then you might care"

## 2018-07-16 NOTE — ED Provider Notes (Signed)
[facility]-EMERGENCY DEPT Provider Note   CSN: [id number] Arrival date & time: 07/15/18  2047     History   Chief Complaint Chief Complaint  Patient presents with  . Psychiatric Evaluation    HPI Dwayne Small is a 42 y.o. male.  Patient presents to the emergency department for evaluation of the need for alcohol detox. He reports he is a daily drinker, last use yesterday. He has sought out treatment programs and states he has been accepted to a rehab program that requires he medically detox prior to admission. He denies history of DT's or seizures from attempting to stop drinking in the past. He currently has no symptoms of nausea, palpitations, tremors, hallucinations, anxiety or agitation. He reports a sore throat that was previously evaluated in the ED on 07/15/18 and diagnosed as viral pharyngitis. No other complaints. No HI/SI/AVH.   The history is provided by the patient. No language interpreter was used.    History reviewed. No pertinent past medical history.  Patient Active Problem List   Diagnosis Date Noted  . Alcohol abuse 11/22/2014  . Alcohol intoxication (HCC) 11/22/2014  . Substance induced mood disorder (HCC) 11/22/2014  . Suicidal ideations 11/22/2014    Past Surgical History:  Procedure Laterality Date  . KNEE SURGERY          Home Medications    Prior to Admission medications   Medication Sig Start Date End Date Taking? Authorizing Provider  ibuprofen (ADVIL,MOTRIN) 800 MG tablet Take 1 tablet (800 mg total) by mouth 3 (three) times daily. 07/15/18   Teressa Lower, NP    Family History Family History  Problem Relation Age of Onset  . Diabetes Mother   . Coronary artery disease Father     Social History Social History   Tobacco Use  . Smoking status: Current Every Day Smoker    Packs/day: 1.00    Years: 20.00    Pack years: 20.00    Types: Cigarettes  . Smokeless tobacco: Never Used  Substance Use Topics  .  Alcohol use: Yes    Comment: Everyday. Last drink: earlier today after discharge from previous visit.   . Drug use: Yes    Types: Marijuana, Cocaine    Comment: Last used: Yesterday      Allergies   Patient has no known allergies.   Review of Systems Review of Systems  Constitutional: Negative for chills and fever.  HENT: Negative.   Respiratory: Negative.   Cardiovascular: Negative.   Gastrointestinal: Negative.  Negative for nausea.  Musculoskeletal: Negative.   Skin: Negative.   Neurological: Negative.  Negative for tremors.  Psychiatric/Behavioral: Negative for agitation, hallucinations and suicidal ideas. The patient is not nervous/anxious.      Physical Exam Updated Vital Signs BP (!) 150/97 (BP Location: Right Arm)   Pulse 94   Temp 99.7 F (37.6 C) (Oral)   Resp 16   Ht 6\' 2"  (1.88 m)   SpO2 99%   BMI 24.39 kg/m   Physical Exam  Constitutional: He is oriented to person, place, and time. He appears well-developed and well-nourished. No distress.  Patient sleeping comfortably on stretcher.   HENT:  Head: Normocephalic.  Neck: Normal range of motion. Neck supple.  Cardiovascular: Normal rate and regular rhythm.  Pulmonary/Chest: Effort normal and breath sounds normal. He has no wheezes. He has no rales.  Abdominal: Soft. Bowel sounds are normal. There is no tenderness. There is no rebound and no guarding.  Musculoskeletal: Normal range of  motion.  Neurological: He is alert and oriented to person, place, and time.  No tremors  Skin: Skin is warm and dry. No rash noted.  Psychiatric: He has a normal mood and affect.  Nursing note and vitals reviewed.    ED Treatments / Results  Labs (all labs ordered are listed, but only abnormal results are displayed) Labs Reviewed - No data to display  EKG None  Radiology No results found.  Procedures Procedures (including critical care time)  Medications Ordered in ED Medications - No data to  display   Initial Impression / Assessment and Plan / ED Course  I have reviewed the triage vital signs and the nursing notes.  Pertinent labs & imaging results that were available during my care of the patient were reviewed by me and considered in my medical decision making (see chart for details).     Patient is here requesting medical detox from alcohol so that he can be admitted to a rehab program. He has no ss/sxs of delirium tremens, has had no seizure activity. He is awake, alert, calm, with normal VS.   We discussed that there is no indication for medical admission for alcohol withdrawal and that we do not routinely detox in the hospital. Will provide resources for outpatient programs. He is felt stable for discharge.   Final Clinical Impressions(s) / ED Diagnoses   Final diagnoses:  None   1. Alcohol abuse  ED Discharge Orders    None       Elpidio Anis, PA-C 07/16/18 0109    Ward, Layla Maw, DO 07/16/18 [phone]

## 2018-07-16 NOTE — ED Notes (Signed)
Pt requesting pain medication because his "neck is swollen". Pt is talking in clear sentences

## 2019-04-08 ENCOUNTER — Emergency Department (HOSPITAL_COMMUNITY)
Admission: EM | Admit: 2019-04-08 | Discharge: 2019-04-08 | Disposition: A | Discharge status: Home / Self Care | Payer: Self-pay | Attending: Emergency Medicine | Admitting: Emergency Medicine

## 2019-04-08 ENCOUNTER — Encounter (HOSPITAL_COMMUNITY): Payer: Self-pay | Admitting: Emergency Medicine

## 2019-04-08 ENCOUNTER — Emergency Department (HOSPITAL_COMMUNITY): Payer: Self-pay

## 2019-04-08 DIAGNOSIS — M549 Dorsalgia, unspecified: Secondary | ICD-10-CM

## 2019-04-08 DIAGNOSIS — S199XXA Unspecified injury of neck, initial encounter: Secondary | ICD-10-CM | POA: Insufficient documentation

## 2019-04-08 DIAGNOSIS — Y998 Other external cause status: Secondary | ICD-10-CM | POA: Insufficient documentation

## 2019-04-08 DIAGNOSIS — F17210 Nicotine dependence, cigarettes, uncomplicated: Secondary | ICD-10-CM | POA: Insufficient documentation

## 2019-04-08 DIAGNOSIS — E119 Type 2 diabetes mellitus without complications: Secondary | ICD-10-CM | POA: Insufficient documentation

## 2019-04-08 DIAGNOSIS — F10920 Alcohol use, unspecified with intoxication, uncomplicated: Secondary | ICD-10-CM | POA: Insufficient documentation

## 2019-04-08 DIAGNOSIS — Y9389 Activity, other specified: Secondary | ICD-10-CM | POA: Insufficient documentation

## 2019-04-08 DIAGNOSIS — S3992XA Unspecified injury of lower back, initial encounter: Secondary | ICD-10-CM | POA: Insufficient documentation

## 2019-04-08 DIAGNOSIS — Y9289 Other specified places as the place of occurrence of the external cause: Secondary | ICD-10-CM | POA: Insufficient documentation

## 2019-04-08 DIAGNOSIS — I2510 Atherosclerotic heart disease of native coronary artery without angina pectoris: Secondary | ICD-10-CM | POA: Insufficient documentation

## 2019-04-08 LAB — COMPREHENSIVE METABOLIC PANEL
ALT: 18 U/L (ref 0–44)
AST: 26 U/L (ref 15–41)
Albumin: 4.3 g/dL (ref 3.5–5.0)
Alkaline Phosphatase: 49 U/L (ref 38–126)
Anion gap: 16 — ABNORMAL HIGH (ref 5–15)
BUN: 6 mg/dL (ref 6–20)
CO2: 22 mmol/L (ref 22–32)
Calcium: 9.4 mg/dL (ref 8.9–10.3)
Chloride: 104 mmol/L (ref 98–111)
Creatinine, Ser: 1.13 mg/dL (ref 0.61–1.24)
GFR calc Af Amer: >60 mL/min (ref >60)
GFR calc non Af Amer: >60 mL/min (ref >60)
Glucose, Bld: 86 mg/dL (ref 70–99)
Potassium: 3.6 mmol/L (ref 3.5–5.1)
Sodium: 142 mmol/L (ref 135–145)
Total Bilirubin: 0.7 mg/dL (ref 0.3–1.2)
Total Protein: 7 g/dL (ref 6.5–8.1)

## 2019-04-08 LAB — CBC WITH DIFFERENTIAL/PLATELET
Abs Immature Granulocytes: 0.12 10*3/uL — ABNORMAL HIGH (ref 0.00–0.07)
Basophils Absolute: 0 10*3/uL (ref 0.0–0.1)
Basophils Relative: 0 %
Eosinophils Absolute: 0 10*3/uL (ref 0.0–0.5)
Eosinophils Relative: 0 %
HCT: 36.4 % — ABNORMAL LOW (ref 39.0–52.0)
Hemoglobin: 12.6 g/dL — ABNORMAL LOW (ref 13.0–17.0)
Immature Granulocytes: 1 %
Lymphocytes Relative: 22 %
Lymphs Abs: 3 10*3/uL (ref 0.7–4.0)
MCH: 31.2 pg (ref 26.0–34.0)
MCHC: 34.6 g/dL (ref 30.0–36.0)
MCV: 90.1 fL (ref 80.0–100.0)
Monocytes Absolute: 1.1 10*3/uL — ABNORMAL HIGH (ref 0.1–1.0)
Monocytes Relative: 8 %
Neutro Abs: 9.5 10*3/uL — ABNORMAL HIGH (ref 1.7–7.7)
Neutrophils Relative %: 69 %
Platelets: 219 10*3/uL (ref 150–400)
RBC: 4.04 MIL/uL — ABNORMAL LOW (ref 4.22–5.81)
RDW: 12.4 % (ref 11.5–15.5)
WBC: 13.8 10*3/uL — ABNORMAL HIGH (ref 4.0–10.5)
nRBC: 0 % (ref 0.0–0.2)

## 2019-04-08 LAB — LIPASE, BLOOD: Lipase: 33 U/L (ref 11–51)

## 2019-04-08 LAB — CBG MONITORING, ED: Glucose-Capillary: 87 mg/dL (ref 70–99)

## 2019-04-08 LAB — I-STAT CREATININE, ED: Creatinine, Ser: 1.3 mg/dL — ABNORMAL HIGH (ref 0.61–1.24)

## 2019-04-08 MED ORDER — IOHEXOL 300 MG/ML  SOLN
100.0000 mL | Freq: Once | INTRAMUSCULAR | Status: AC | PRN
  Administered 2019-04-08: 100 mL via INTRAVENOUS

## 2019-04-08 MED ORDER — SODIUM CHLORIDE 0.9 % IV BOLUS
1000.0000 mL | Freq: Once | INTRAVENOUS | Status: AC
  Administered 2019-04-08: 05:00:00 1000 mL via INTRAVENOUS

## 2019-04-08 MED ORDER — SODIUM CHLORIDE 0.9 % IV BOLUS
1000.0000 mL | Freq: Once | INTRAVENOUS | Status: AC
  Administered 2019-04-08: 07:00:00 1000 mL via INTRAVENOUS

## 2019-04-08 MED ORDER — SODIUM CHLORIDE 0.9 % IV SOLN: INTRAVENOUS | Status: DC
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
## 2019-04-08 NOTE — ED Notes (Signed)
Pt verbalized understanding of discharge instructions. Pt alert and oriented. IV removed with bleeding controlled. Pt ambulatory with steady gait to lobby.

## 2020-04-23 DIAGNOSIS — F1020 Alcohol dependence, uncomplicated: Secondary | ICD-10-CM | POA: Insufficient documentation
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
## 2021-01-13 DIAGNOSIS — S36439A Laceration of unspecified part of small intestine, initial encounter: Secondary | ICD-10-CM | POA: Insufficient documentation

## 2021-01-13 DIAGNOSIS — S37032A Laceration of left kidney, unspecified degree, initial encounter: Secondary | ICD-10-CM | POA: Insufficient documentation

## 2021-01-13 DIAGNOSIS — S31109A Unspecified open wound of abdominal wall, unspecified quadrant without penetration into peritoneal cavity, initial encounter: Secondary | ICD-10-CM | POA: Insufficient documentation

## 2021-01-13 DIAGNOSIS — T1490XA Injury, unspecified, initial encounter: Secondary | ICD-10-CM | POA: Insufficient documentation

## 2021-01-13 HISTORY — DX: Laceration of left kidney, unspecified degree, initial encounter: S37.032A

## 2021-02-18 ENCOUNTER — Emergency Department (HOSPITAL_COMMUNITY)
Admission: EM | Admit: 2021-02-18 | Discharge: 2021-02-19 | Disposition: A | Discharge status: Home / Self Care | Payer: Self-pay | Attending: Emergency Medicine | Admitting: Emergency Medicine

## 2021-02-18 ENCOUNTER — Other Ambulatory Visit: Payer: Self-pay

## 2021-02-18 ENCOUNTER — Encounter (HOSPITAL_COMMUNITY): Payer: Self-pay

## 2021-02-18 DIAGNOSIS — F17210 Nicotine dependence, cigarettes, uncomplicated: Secondary | ICD-10-CM | POA: Insufficient documentation

## 2021-02-18 DIAGNOSIS — R109 Unspecified abdominal pain: Secondary | ICD-10-CM | POA: Insufficient documentation

## 2021-02-18 DIAGNOSIS — G8918 Other acute postprocedural pain: Secondary | ICD-10-CM | POA: Insufficient documentation

## 2021-02-18 LAB — COMPREHENSIVE METABOLIC PANEL
ALT: 15 U/L (ref 0–44)
AST: 20 U/L (ref 15–41)
Albumin: 4.2 g/dL (ref 3.5–5.0)
Alkaline Phosphatase: 56 U/L (ref 38–126)
Anion gap: 15 (ref 5–15)
BUN: 16 mg/dL (ref 6–20)
CO2: 22 mmol/L (ref 22–32)
Calcium: 8.8 mg/dL — ABNORMAL LOW (ref 8.9–10.3)
Chloride: 99 mmol/L (ref 98–111)
Creatinine, Ser: 0.99 mg/dL (ref 0.61–1.24)
GFR, Estimated: >60 mL/min (ref >60)
Glucose, Bld: 89 mg/dL (ref 70–99)
Potassium: 3.4 mmol/L — ABNORMAL LOW (ref 3.5–5.1)
Sodium: 136 mmol/L (ref 135–145)
Total Bilirubin: 0.4 mg/dL (ref 0.3–1.2)
Total Protein: 7.1 g/dL (ref 6.5–8.1)

## 2021-02-18 LAB — CBC WITH DIFFERENTIAL/PLATELET
Abs Immature Granulocytes: 0.01 10*3/uL (ref 0.00–0.07)
Basophils Absolute: 0 10*3/uL (ref 0.0–0.1)
Basophils Relative: 0 %
Eosinophils Absolute: 0.1 10*3/uL (ref 0.0–0.5)
Eosinophils Relative: 2 %
HCT: 33.4 % — ABNORMAL LOW (ref 39.0–52.0)
Hemoglobin: 11.6 g/dL — ABNORMAL LOW (ref 13.0–17.0)
Immature Granulocytes: 0 %
Lymphocytes Relative: 50 %
Lymphs Abs: 2.7 10*3/uL (ref 0.7–4.0)
MCH: 31.1 pg (ref 26.0–34.0)
MCHC: 34.7 g/dL (ref 30.0–36.0)
MCV: 89.5 fL (ref 80.0–100.0)
Monocytes Absolute: 0.7 10*3/uL (ref 0.1–1.0)
Monocytes Relative: 12 %
Neutro Abs: 2 10*3/uL (ref 1.7–7.7)
Neutrophils Relative %: 36 %
Platelets: 195 10*3/uL (ref 150–400)
RBC: 3.73 MIL/uL — ABNORMAL LOW (ref 4.22–5.81)
RDW: 12.6 % (ref 11.5–15.5)
WBC: 5.5 10*3/uL (ref 4.0–10.5)
nRBC: 0 % (ref 0.0–0.2)

## 2021-02-18 LAB — LIPASE, BLOOD: Lipase: 32 U/L (ref 11–51)

## 2021-02-18 MED ORDER — OXYCODONE-ACETAMINOPHEN 5-325 MG PO TABS: 1.0000 | ORAL_TABLET | Freq: Four times a day (QID) | ORAL | 0 refills | Status: DC | PRN

## 2021-02-18 MED ORDER — OXYCODONE-ACETAMINOPHEN 5-325 MG PO TABS
2.0000 | ORAL_TABLET | Freq: Once | ORAL | Status: AC
  Administered 2021-02-18: 2 via ORAL
  Filled 2021-02-18: qty 2

## 2021-02-18 NOTE — Discharge Instructions (Addendum)
Your laboratory work-up was reassuring.  Your vital signs are normal.  I sent some pain pills to your pharmacy.  Please follow-up with your surgeons.  I will have our social workers contact you regarding assistance for being seen by pain management.  They should call you on Monday or Tuesday.

## 2021-02-18 NOTE — ED Triage Notes (Signed)
Patient arrived reporting abdominal pain around his incisions from surgery in March that started two nights ago. Also reporting feeling useless but currently declining SI/HI

## 2021-02-18 NOTE — ED Triage Notes (Signed)
Emergency Medicine Provider Triage Evaluation Note  Dwayne Small , a 45 y.o. male  was evaluated in triage.  Pt complains of abdominal pain along his incision line.  Patient reports pain started last night has been constant since then.  Patient denies any fevers, chills, nausea, vomiting, diarrhea, constipation.  Patient endorses EtOH use.  Patient endorses passive suicidality denies any plan to harm himself, says he would not go through with harming self.  Patient denies any homicidal ideations, auditory hallucinations, visual hallucinations..    Review of Systems  Positive: Abdominal pain Negative: Fever, chills, nausea, vomiting, homicidal ideation, auditory hallucinations, visual hallucinations.  Physical Exam  BP 100/87 (BP Location: Right Arm)   Pulse (!) 102   Temp 98.1 F (36.7 C) (Oral)   Resp 18   Ht 6\' 2"  (1.88 m)   Wt 81.6 kg   SpO2 97%   BMI 23.11 kg/m  Gen:   Awake, no distress   HEENT:  Atraumatic  Resp:  Normal effort  Cardiac:  Tachycardic at rate of 102 Abd:   Nondistended, tender on midline incision MSK:   Moves extremities without difficulty  Neuro:  Speech clear   Medical Decision Making  Medically screening exam initiated at 9:04 PM.  Appropriate orders placed.  Dwayne Small was informed that the remainder of the evaluation will be completed by another provider, this initial triage assessment does not replace that evaluation, and the importance of remaining in the ED until their evaluation is complete.  Clinical Impression   The patient appears stable so that the remainder of the work up may be completed by another provider.      Dwayne Small, Dwayne Small 02/18/21 2106
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
## 2021-03-14 ENCOUNTER — Ambulatory Visit (INDEPENDENT_AMBULATORY_CARE_PROVIDER_SITE_OTHER): Payer: Self-pay | Admitting: Family

## 2021-03-14 ENCOUNTER — Other Ambulatory Visit: Payer: Self-pay

## 2021-03-14 ENCOUNTER — Encounter: Payer: Self-pay | Admitting: Family

## 2021-03-14 VITALS — BP 115/82 | HR 98 | Temp 97.7°F | Resp 16 | Ht 74.49 in | Wt 210.8 lb

## 2021-03-14 DIAGNOSIS — Z7689 Persons encountering health services in other specified circumstances: Secondary | ICD-10-CM

## 2021-03-14 DIAGNOSIS — G894 Chronic pain syndrome: Secondary | ICD-10-CM

## 2021-03-14 DIAGNOSIS — Z599 Problem related to housing and economic circumstances, unspecified: Secondary | ICD-10-CM
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
## 2021-03-14 NOTE — Patient Instructions (Signed)
- Return for annual physical examination, labs, and health maintenance. Arrive fasting meaning having no food for at least 8 hours prior to appointment. You may have only water or black coffee. Please take scheduled medications as normal. Thank you for choosing Primary Care at [facility] for your medical home!    Dwayne Small was seen by Rema Fendt, NP today.   Lucinda Dell primary care provider is Rema Fendt, NP.   For the best care possible,  you should try to see Ricky Stabs, NP whenever you come to clinic.   We look forward to seeing you again soon!  If you have any questions about your visit today,  please call us at [phone]  Or feel free to reach your provider via MyChart.    Keeping you healthy   Get these tests  Blood pressure- Have your blood pressure checked once a year by your healthcare provider.  Normal blood pressure is 120/80.  Weight- Have your body mass index (BMI) calculated to screen for obesity.  BMI is a measure of body fat based on height and weight. You can also calculate your own BMI at https://www.west-esparza.com/.  Cholesterol- Have your cholesterol checked regularly starting at age 75, sooner may be necessary if you have diabetes, high blood pressure, if a family member developed heart diseases at an early age or if you smoke.   Chlamydia, HIV, and other sexual transmitted disease- Get screened each year until the age of 49 then within three months of each new sexual partner.  Diabetes- Have your blood sugar checked regularly if you have high blood pressure, high cholesterol, a family history of diabetes or if you are overweight.   Get these vaccines  Flu shot- Every fall.  Tetanus shot- Every 10 years.  Menactra- Single dose; prevents meningitis.   Take these steps  Don't smoke- If you do smoke, ask your healthcare provider about quitting. For tips on how to quit, go to www.smokefree.gov or call 1-800-QUIT-NOW.  Be physically  active- Exercise 5 days a week for at least 30 minutes.  If you are not already physically active start slow and gradually work up to 30 minutes of moderate physical activity.  Examples of moderate activity include walking briskly, mowing the yard, dancing, swimming bicycling, etc.  Eat a healthy diet- Eat a variety of healthy foods such as fruits, vegetables, low fat milk, low fat cheese, yogurt, lean meats, poultry, fish, beans, tofu, etc.  For more information on healthy eating, go to www.thenutritionsource.org  Drink alcohol in moderation- Limit alcohol intake two drinks or less a day.  Never drink and drive.  Dentist- Brush and floss teeth twice daily; visit your dentis twice a year.  Depression-Your emotional health is as important as your physical health.  If you're feeling down, losing interest in things you normally enjoy please talk with your healthcare provider.  Gun Safety- If you keep a gun in your home, keep it unloaded and with the safety lock on.  Bullets should be stored separately.  Helmet use- Always wear a helmet when riding a motorcycle, bicycle, rollerblading or skateboarding.  Safe sex- If you may be exposed to a sexually transmitted infection, use a condom  Seat belts- Seat bels can save your life; always wear one.  Smoke/Carbon Monoxide detectors- These detectors need to be installed on the appropriate level of your home.  Replace batteries at least once a year.  Skin Cancer- When out in the sun, cover up and use  sunscreen SPF 15 or higher.  Violence- If anyone is threatening or hurting you, please tell your healthcare provider.

## 2021-03-21 ENCOUNTER — Ambulatory Visit: Payer: Self-pay

## 2021-03-27 ENCOUNTER — Other Ambulatory Visit: Payer: Self-pay

## 2021-03-27 ENCOUNTER — Ambulatory Visit: Payer: Self-pay | Attending: Family Medicine
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
## 2021-04-10 NOTE — Progress Notes (Signed)
Patient did not show for appointment.   

## 2021-04-11 ENCOUNTER — Encounter: Payer: Self-pay | Admitting: Family

## 2021-04-11 DIAGNOSIS — Z1329 Encounter for screening for other suspected endocrine disorder: Secondary | ICD-10-CM

## 2021-04-11 DIAGNOSIS — Z13220 Encounter for screening for lipoid disorders: Secondary | ICD-10-CM

## 2021-04-11 DIAGNOSIS — Z114 Encounter for screening for human immunodeficiency virus [HIV]: Secondary | ICD-10-CM

## 2021-04-11 DIAGNOSIS — Z0000 Encounter for general adult medical examination without abnormal findings: Secondary | ICD-10-CM

## 2021-04-11 DIAGNOSIS — Z1211 Encounter for screening for malignant neoplasm of colon: Secondary | ICD-10-CM

## 2021-04-11 DIAGNOSIS — Z130 Encounter for screening for diseases of the blood and blood-forming organs and certain disorders involving the immune mechanism: Secondary | ICD-10-CM

## 2021-04-11 DIAGNOSIS — Z131 Encounter for screening for diabetes mellitus: Secondary | ICD-10-CM

## 2021-04-11 DIAGNOSIS — Z13228 Encounter for screening for other metabolic disorders: Secondary | ICD-10-CM

## 2021-04-11 DIAGNOSIS — Z1159 Encounter for screening for other viral diseases: Secondary | ICD-10-CM
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
## 2024-05-04 ENCOUNTER — Other Ambulatory Visit (HOSPITAL_BASED_OUTPATIENT_CLINIC_OR_DEPARTMENT_OTHER): Payer: Self-pay

## 2024-05-04 ENCOUNTER — Encounter (HOSPITAL_COMMUNITY): Payer: Self-pay

## 2024-05-04 ENCOUNTER — Other Ambulatory Visit (HOSPITAL_COMMUNITY): Payer: Self-pay

## 2024-05-04 ENCOUNTER — Emergency Department (HOSPITAL_COMMUNITY)
Admission: EM | Admit: 2024-05-04 | Discharge: 2024-05-04 | Disposition: A | Discharge status: Home / Self Care | Attending: Emergency Medicine | Admitting: Emergency Medicine

## 2024-05-04 ENCOUNTER — Other Ambulatory Visit: Payer: Self-pay

## 2024-05-04 DIAGNOSIS — R110 Nausea: Secondary | ICD-10-CM | POA: Diagnosis present

## 2024-05-04 DIAGNOSIS — F1910 Other psychoactive substance abuse, uncomplicated: Secondary | ICD-10-CM | POA: Diagnosis not present

## 2024-05-04 LAB — COMPREHENSIVE METABOLIC PANEL WITH GFR
ALT: 18 U/L (ref 0–44)
AST: 20 U/L (ref 15–41)
Albumin: 3.5 g/dL (ref 3.5–5.0)
Alkaline Phosphatase: 63 U/L (ref 38–126)
Anion gap: 7 (ref 5–15)
BUN: 15 mg/dL (ref 6–20)
CO2: 23 mmol/L (ref 22–32)
Calcium: 9.3 mg/dL (ref 8.9–10.3)
Chloride: 106 mmol/L (ref 98–111)
Creatinine, Ser: 0.67 mg/dL (ref 0.61–1.24)
GFR, Estimated: >60 mL/min (ref >60)
Glucose, Bld: 122 mg/dL — ABNORMAL HIGH (ref 70–99)
Potassium: 3.7 mmol/L (ref 3.5–5.1)
Sodium: 136 mmol/L (ref 135–145)
Total Bilirubin: 0.6 mg/dL (ref 0.0–1.2)
Total Protein: 7.1 g/dL (ref 6.5–8.1)

## 2024-05-04 LAB — CBC WITH DIFFERENTIAL/PLATELET
Abs Immature Granulocytes: 0.02 10*3/uL (ref 0.00–0.07)
Basophils Absolute: 0 10*3/uL (ref 0.0–0.1)
Basophils Relative: 0 %
Eosinophils Absolute: 0 10*3/uL (ref 0.0–0.5)
Eosinophils Relative: 0 %
HCT: 37.2 % — ABNORMAL LOW (ref 39.0–52.0)
Hemoglobin: 12.6 g/dL — ABNORMAL LOW (ref 13.0–17.0)
Immature Granulocytes: 0 %
Lymphocytes Relative: 13 %
Lymphs Abs: 1.1 10*3/uL (ref 0.7–4.0)
MCH: 30.7 pg (ref 26.0–34.0)
MCHC: 33.9 g/dL (ref 30.0–36.0)
MCV: 90.7 fL (ref 80.0–100.0)
Monocytes Absolute: 0.5 10*3/uL (ref 0.1–1.0)
Monocytes Relative: 6 %
Neutro Abs: 6.5 10*3/uL (ref 1.7–7.7)
Neutrophils Relative %: 81 %
Platelets: 331 10*3/uL (ref 150–400)
RBC: 4.1 MIL/uL — ABNORMAL LOW (ref 4.22–5.81)
RDW: 11.9 % (ref 11.5–15.5)
WBC: 8 10*3/uL (ref 4.0–10.5)
nRBC: 0 % (ref 0.0–0.2)

## 2024-05-04 LAB — RAPID HIV SCREEN (HIV 1/2 AB+AG)
HIV 1/2 Antibodies: NONREACTIVE
HIV-1 P24 Antigen - HIV24: NONREACTIVE

## 2024-05-04 LAB — ETHANOL: Alcohol, Ethyl (B): <15 mg/dL (ref <15)

## 2024-05-04 MED ORDER — LOPERAMIDE HCL 2 MG PO CAPS: 2.0000 mg | ORAL_CAPSULE | ORAL | Status: DC | PRN

## 2024-05-04 MED ORDER — NAPROXEN 500 MG PO TABS
500.0000 mg | ORAL_TABLET | Freq: Two times a day (BID) | ORAL | 0 refills | Status: AC
  Filled 2024-05-04: qty 30, 15d supply, fill #0

## 2024-05-04 MED ORDER — HYDROXYZINE HCL 25 MG PO TABS: 25.0000 mg | ORAL_TABLET | Freq: Four times a day (QID) | ORAL | Status: DC | PRN

## 2024-05-04 MED ORDER — DICYCLOMINE HCL 20 MG PO TABS: 20.0000 mg | ORAL_TABLET | Freq: Four times a day (QID) | ORAL | Status: DC | PRN

## 2024-05-04 MED ORDER — NAPROXEN 500 MG PO TABS: 500.0000 mg | ORAL_TABLET | Freq: Two times a day (BID) | ORAL | Status: DC | PRN

## 2024-05-04 MED ORDER — LACTATED RINGERS IV BOLUS
1000.0000 mL | Freq: Once | INTRAVENOUS | Status: AC
  Administered 2024-05-04: 1000 mL via INTRAVENOUS

## 2024-05-04 MED ORDER — ONDANSETRON HCL 4 MG/2ML IJ SOLN
4.0000 mg | Freq: Once | INTRAMUSCULAR | Status: AC
  Administered 2024-05-04: 4 mg via INTRAVENOUS
  Filled 2024-05-04: qty 2

## 2024-05-04 MED ORDER — DICYCLOMINE HCL 20 MG PO TABS
20.0000 mg | ORAL_TABLET | Freq: Two times a day (BID) | ORAL | 0 refills | Status: AC
  Filled 2024-05-04: qty 20, 10d supply, fill #0

## 2024-05-04 MED ORDER — METHOCARBAMOL 500 MG PO TABS: 500.0000 mg | ORAL_TABLET | Freq: Three times a day (TID) | ORAL | Status: DC | PRN

## 2024-05-04 MED ORDER — ONDANSETRON 8 MG PO TBDP
8.0000 mg | ORAL_TABLET | Freq: Three times a day (TID) | ORAL | 0 refills | Status: AC | PRN
  Filled 2024-05-04: qty 12, 4d supply, fill #0

## 2024-05-04 NOTE — ED Triage Notes (Signed)
 PT has been experience withdrawal since yesterday. Withdrawing from fentanyl and methamphetamines. Tremors, cold sweats, abd pain, and unable to sit still. Last used yesterday morning.

## 2024-05-04 NOTE — Discharge Instructions (Addendum)
 Please review the discharge instructions for information about outpatient treatment centers to help you with your drug use.  STI screening was ordered today as you requested.  The labs will be available in the my chart portal.   We will try to contact you if they are abnormal.
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
## 2024-05-05 LAB — RPR: RPR Ser Ql: NONREACTIVE

## 2024-05-05 LAB — GC/CHLAMYDIA PROBE AMP (~~LOC~~) NOT AT ARMC
Chlamydia: NEGATIVE
Comment: NEGATIVE
Comment: NORMAL
Neisseria Gonorrhea: NEGATIVE

## 2024-05-06 ENCOUNTER — Ambulatory Visit (HOSPITAL_COMMUNITY): Admission: EM | Admit: 2024-05-06 | Discharge: 2024-05-06 | Disposition: A | Discharge status: Home / Self Care

## 2024-05-06 DIAGNOSIS — F1120 Opioid dependence, uncomplicated: Secondary | ICD-10-CM

## 2024-05-06 DIAGNOSIS — F1510 Other stimulant abuse, uncomplicated: Secondary | ICD-10-CM | POA: Diagnosis not present

## 2024-05-06 DIAGNOSIS — F1124 Opioid dependence with opioid-induced mood disorder: Secondary | ICD-10-CM | POA: Diagnosis not present

## 2024-05-06 NOTE — ED Provider Notes (Signed)
 Behavioral Health Urgent Care Medical Screening Exam  Patient Name: Dwayne Small MRN: [id number] Date of Evaluation: 05/06/24 Chief Complaint:  Request for detox Diagnosis:  Final diagnoses:  Opioid dependence with opioid-induced mood disorder (HCC)  Fentanyl dependence (HCC)  Methamphetamine abuse (HCC)   History of Present illness: Per triage: Dwayne Small is a 48 year old male presenting to [facility] unaccompanied. Pt states he is coming off of meth and fetnayl. Pt reports he is using these substance everyday. Pt reports his last use was on Saturday. Pt states he is withdrawling terribly. Pt mentions he is foggy, shaking, unable to sleep, severe pain, anxiety, depression, slight appetite and weak. Pt mentions he has been using these substances for one whole year. Pt denies substance use in the past 24 hours, Si, Hi and AVH.   Assessment: At the beginning of  encounter with this patient, he makes it clear that that he would like Suboxone, methadone, oxycodone  or another strong pain killer because I am using Fentanyl and Meth because I need some pain killers for my stomach.  Patient is on pain medication, and when writer states that MAT therapy is not being done in this building, but the resources for the psychotherapy will be provided to him and his after visit summary and that if he chooses to stay here, he will be detoxed of substances of abuse using clonidine if he truly he is using just fentanyl and meth.  It is also explained to him that a urine drug screen will be done to determine substances of abuse, after which the appropriate treatment will be determined.  Patient chose to get up and leave prior to the comprehensive assessment being completed when he was told about the medication assisted therapy not being done at this facility.  Prior to leaving, he presented with flat affect and depressed mood, attention to personal hygiene and grooming is fair, eye contact is good, speech is clear &  coherent. Thought contents are organized and logical, and pt currently denies SI/HI/AVH or paranoia. There is no evidence of delusional thoughts.  There are no overt signs of psychosis.  Denies prior suicide attempts, denies mental health related hospitalizations, as per chart review, patient presented to the [facility] ED on 06/20 seeking detox, did not stay in the ED.  Suicide Risk Assessment: Minimal: No identifiable suicidal ideation.  Patients presenting with no risk factors but with morbid ruminations; may be classified as minimal risk based on the severity of the depressive symptoms.  Recommendations: Patient reports that he will follow up with [facility] on Monday for MAT therapy. Refused to stay for his AVS.  Flowsheet Row ED from 05/06/2024 in [facility] ED from 05/04/2024 in [facility] Emergency Department at [facility] ED from 02/18/2021 in [facility] Emergency Department at [facility]  C-SSRS RISK CATEGORY No Risk No Risk No Risk    Psychiatric Specialty Exam  Presentation  General Appearance:Disheveled; Casual  Eye Contact:Fair  Speech:Clear and Coherent  Speech Volume:Normal  Handedness:Right   Mood and Affect  Mood:Depressed  Affect:Congruent   Thought Process  Thought Processes:Coherent  Descriptions of Associations:Intact  Orientation:Full (Time, Place and Person)  Thought Content:Logical    Hallucinations:None  Ideas of Reference:None  Suicidal Thoughts:No  Homicidal Thoughts:No   Sensorium  Memory:Immediate Fair  Judgment:Fair  Insight:Fair   Executive Functions  Concentration:Fair  Attention Span:Fair  Recall:Fair  Fund of Knowledge:Fair  Language:Fair   Psychomotor Activity  Psychomotor Activity:Normal   Assets  Assets:Resilience   Sleep  Sleep:Poor  Number of hours: No data recorded  Physical Exam: Physical Exam Vitals and nursing note reviewed.  Neurological:      General: No focal deficit present.     Mental Status: He is oriented to person, place, and time.  Psychiatric:        Mood and Affect: Mood normal.        Behavior: Behavior normal.        Thought Content: Thought content normal.        Judgment: Judgment normal.    Review of Systems  Psychiatric/Behavioral:  Positive for depression and substance abuse. Negative for hallucinations, memory loss and suicidal ideas. The patient is nervous/anxious and has insomnia.   All other systems reviewed and are negative.  Blood pressure (!) 110/97, pulse 100, temperature 98.7 F (37.1 C), temperature source Oral, resp. rate 19, SpO2 99%. There is no height or weight on file to calculate BMI.  Musculoskeletal: Strength & Muscle Tone: within normal limits Gait & Station: normal Patient leans: N/A   [facility] MSE Discharge Disposition for Follow up and Recommendations: Based on my evaluation the patient does not appear to have an emergency medical condition and can be discharged with resources and follow up care in outpatient services for Medication Management, Substance Abuse Intensive Outpatient Program, and Individual Therapy  Refused to stay for AVS and refused to take recommended resources Educated as follows: Get help right away if: You have thoughts about hurting yourself or others. Get help right away if you feel like you may hurt yourself or others, or have thoughts about taking your own life. Go to your nearest emergency room or: Call 911. Call the National Suicide Prevention Lifeline at 9594731239 or 988 in the [place] This is open 24 hours a day. If you're a Veteran: Call 988 and press 1. This is open 24 hours a day. Text the [employer] at 430-489-5641. Summary Mental health is not just the absence of mental illness. It involves understanding your emotions and behaviors, and taking steps to manage them in a healthy way. If you have symptoms of mental or emotional distress, get help  from family, friends, a health care provider, or a mental health professional. Practice good mental health behaviors such as stress management skills, self-calming skills, exercise, healthy sleeping and eating, and supportive relationships. This information is not intended to replace advice given to you by your health care provider. Make sure you discuss any questions you have with your health care provider.    Education provided on the fact that if experiencing worsening of psychiatry symptoms including suicidal ideations, homicidal ideations, or having auditory/visual hallucinations, etc, to call 911, 988, come back to this location, or go to the nearest ER. Pt verbalized understanding.   Donia Snell, NP 05/06/2024, 7:16 PM
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
